# Patient Record
Sex: Female | Born: 1965 | Hispanic: No | Marital: Married | Smoking: Never smoker
Health system: Southern US, Community
[De-identification: ages and names within clinical notes are randomized; demographics above are authoritative.]

## PROBLEM LIST (undated history)

## (undated) DIAGNOSIS — K589 Irritable bowel syndrome without diarrhea: Secondary | ICD-10-CM

## (undated) DIAGNOSIS — T8859XA Other complications of anesthesia, initial encounter: Secondary | ICD-10-CM

## (undated) DIAGNOSIS — L309 Dermatitis, unspecified: Secondary | ICD-10-CM

## (undated) DIAGNOSIS — F32A Depression, unspecified: Secondary | ICD-10-CM

## (undated) DIAGNOSIS — K5790 Diverticulosis of intestine, part unspecified, without perforation or abscess without bleeding: Secondary | ICD-10-CM

## (undated) DIAGNOSIS — I10 Essential (primary) hypertension: Secondary | ICD-10-CM

## (undated) DIAGNOSIS — J45909 Unspecified asthma, uncomplicated: Secondary | ICD-10-CM

## (undated) DIAGNOSIS — J309 Allergic rhinitis, unspecified: Secondary | ICD-10-CM

## (undated) DIAGNOSIS — E039 Hypothyroidism, unspecified: Secondary | ICD-10-CM

## (undated) DIAGNOSIS — E559 Vitamin D deficiency, unspecified: Secondary | ICD-10-CM

## (undated) HISTORY — PX: CARPAL TUNNEL RELEASE: SHX101

## (undated) HISTORY — DX: Diverticulosis of intestine, part unspecified, without perforation or abscess without bleeding: K57.90

## (undated) HISTORY — DX: Hypothyroidism, unspecified: E03.9

## (undated) HISTORY — DX: Allergic rhinitis, unspecified: J30.9

## (undated) HISTORY — PX: OTHER SURGICAL HISTORY: SHX169

## (undated) HISTORY — DX: Irritable bowel syndrome without diarrhea: K58.9

## (undated) HISTORY — PX: FINGER SURGERY: SHX640

## (undated) HISTORY — PX: COLONOSCOPY: SHX174

## (undated) HISTORY — DX: Essential (primary) hypertension: I10

---

## 2001-01-03 ENCOUNTER — Other Ambulatory Visit: Admission: RE | Admit: 2001-01-03 | Discharge: 2001-01-03 | Payer: Self-pay | Admitting: Family Medicine

## 2006-03-09 ENCOUNTER — Ambulatory Visit: Payer: Self-pay

## 2007-08-15 IMAGING — CR CERVICAL SPINE - COMPLETE 4+ VIEW
1 series · 8 of 8 positions shown · non-contrast
Comparison: none

REASON FOR EXAM: xray c-spine pain in neck STAT CALL REPORT [id number]
COMMENTS:

[Series 1: view not recorded · 0.17mm/px · 8 of 8 slices shown]
[im 1/8]
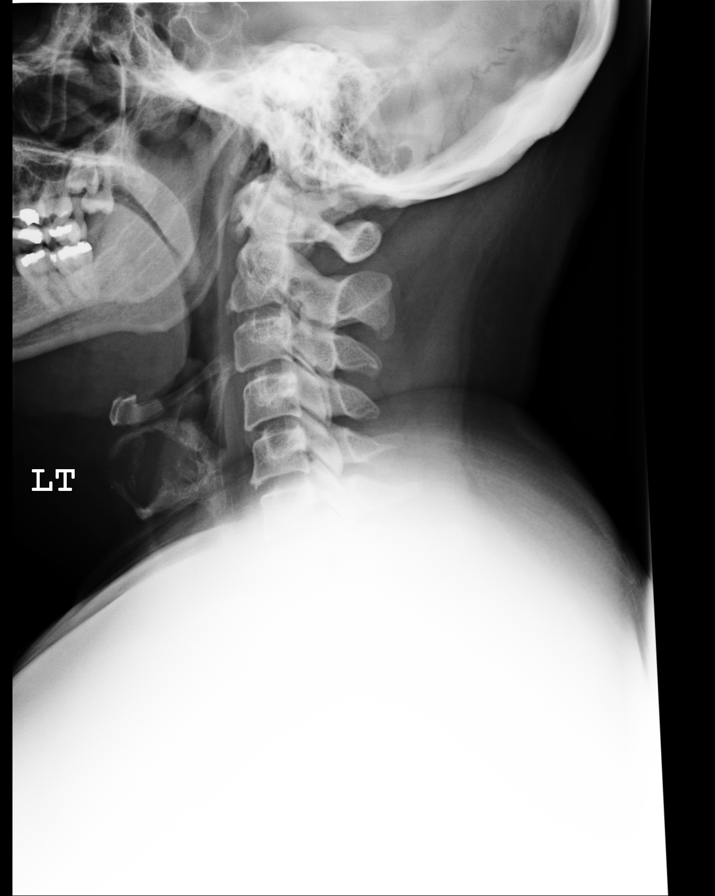
[im 2/8]
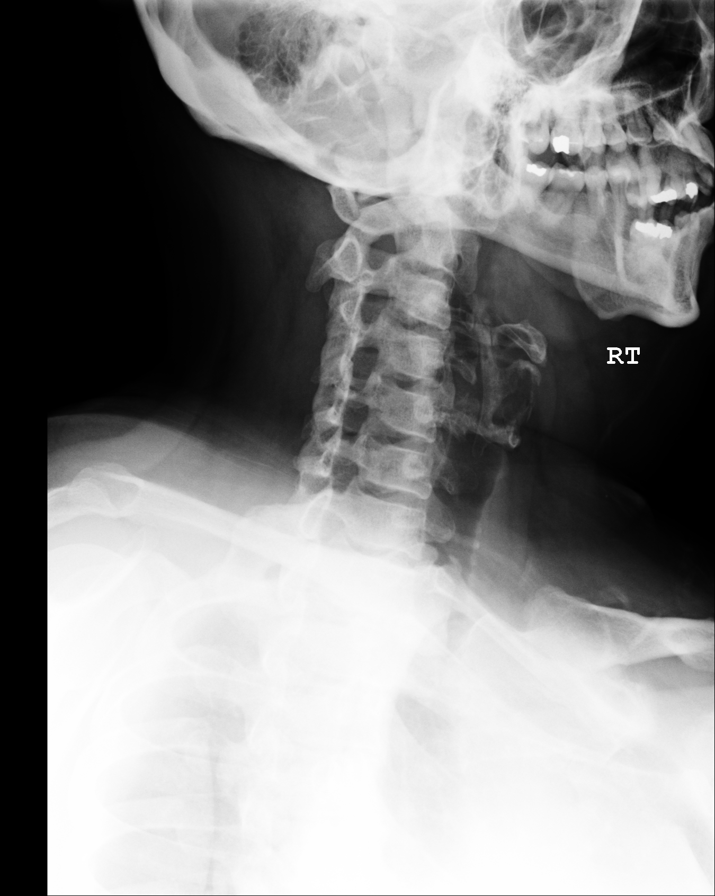
[im 3/8]
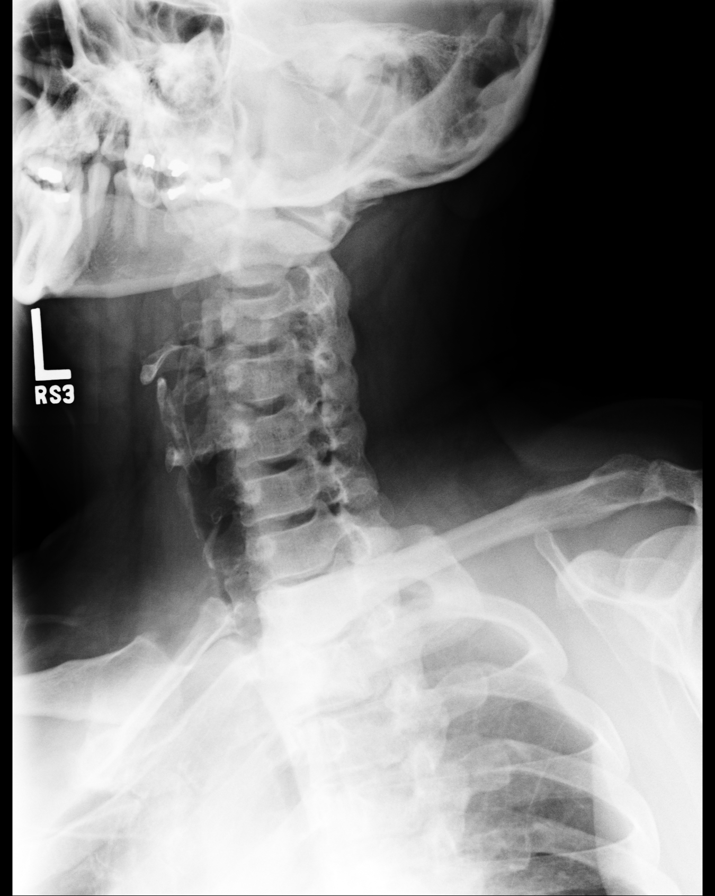
[im 4/8]
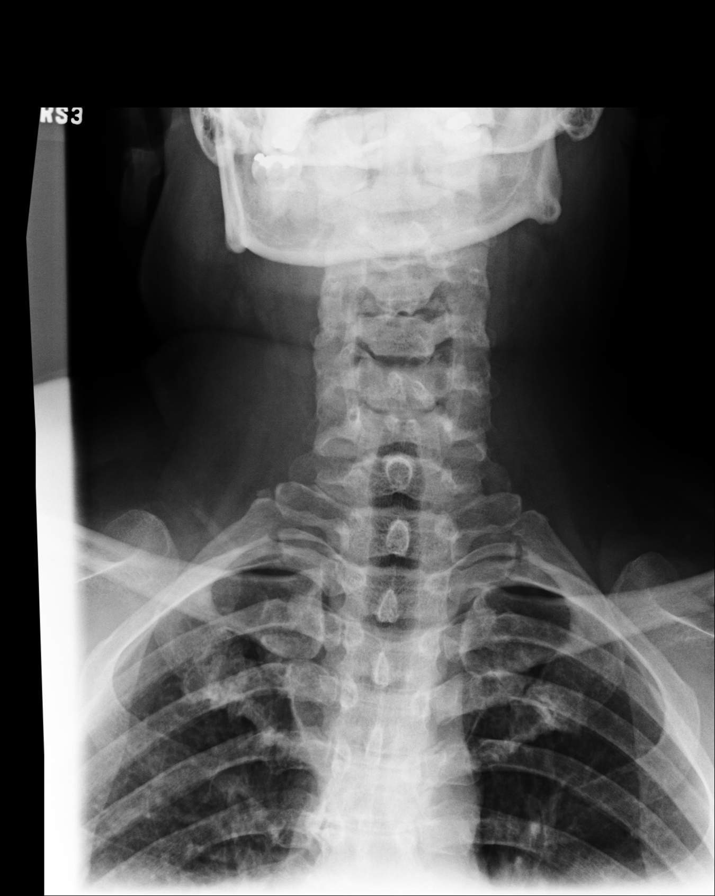
[im 5/8]
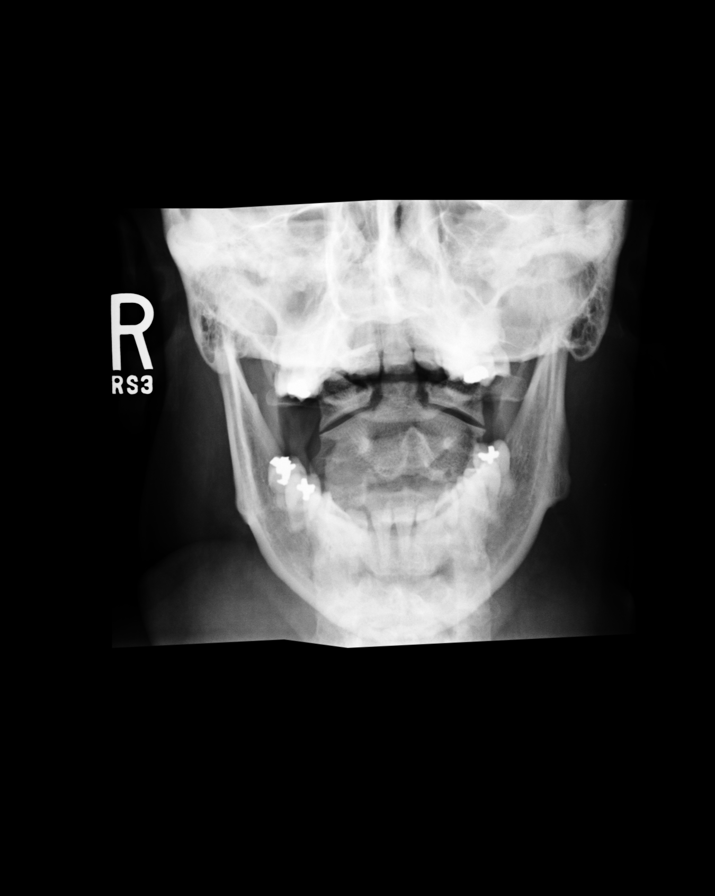
[im 6/8]
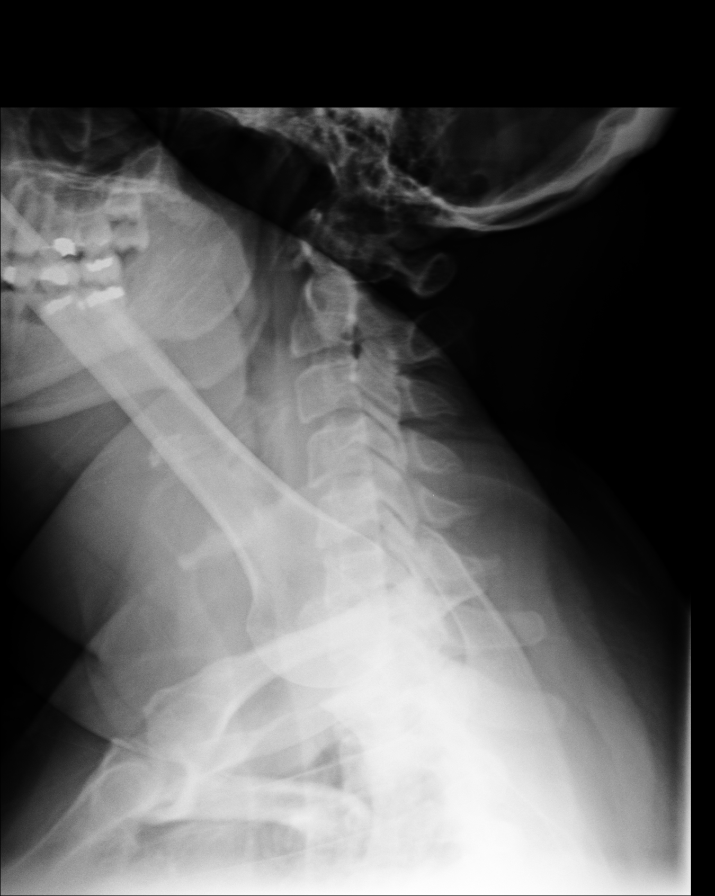
[im 7/8]
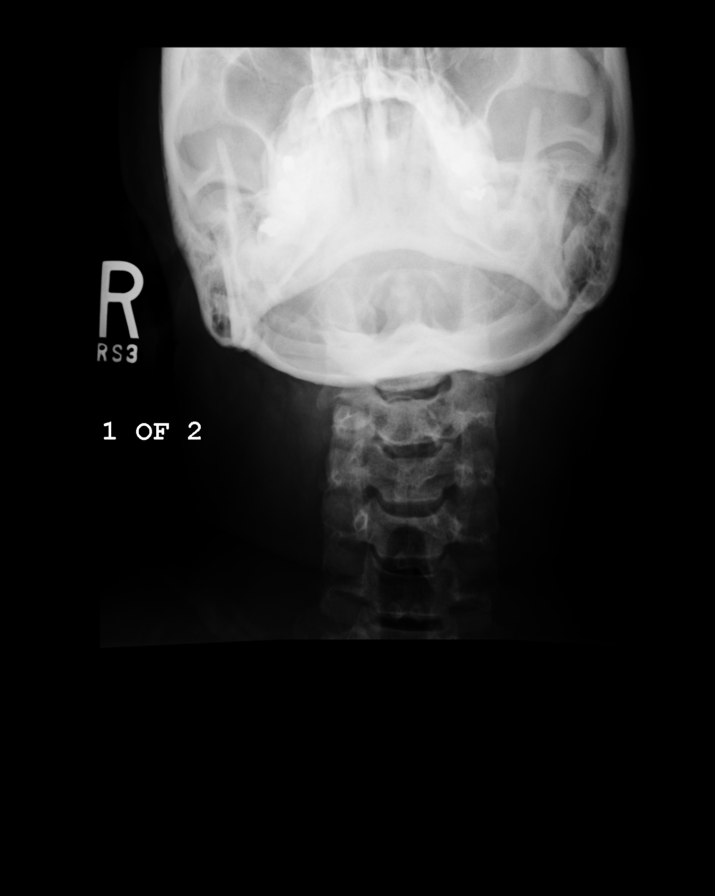
[im 8/8]
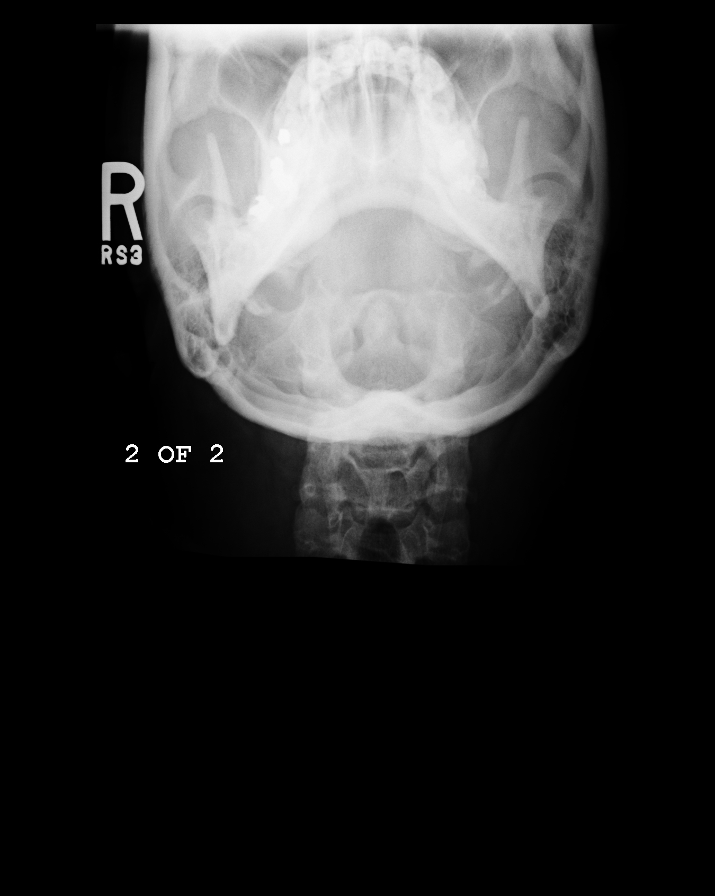

[8 of 8 positions shown; findings below may reference images not displayed]

PROCEDURE:     DXR - DXR CERVICAL SPINE COMPLETE  - March 09, 2006 [DATE]

RESULT:     The vertebral body heights and the intervertebral disc spaces
are well maintained. The vertebral body alignment is normal. Oblique views
show the neural foramina to be patent bilaterally. The odontoid process is
intact. In the AP view there is noted a mild cervicothoracic scoliosis with
the convexity to the LEFT.  The soft tissues of the cervical area show no
significant abnormalities.  No cervical rib formation is seen.
IMPRESSION: 1)No acute changes are identified.

2)There is a mild cervicothoracic scoliosis with the convexity to the LEFT.

3)Although not mentioned above slight degenerative spurring is noted
anteriorly at multiple levels.

## 2010-01-13 ENCOUNTER — Ambulatory Visit: Payer: Self-pay | Admitting: Family Medicine

## 2010-06-26 ENCOUNTER — Ambulatory Visit: Payer: Self-pay | Admitting: Internal Medicine

## 2010-06-30 ENCOUNTER — Ambulatory Visit: Payer: Self-pay | Admitting: Obstetrics and Gynecology

## 2010-06-30 ENCOUNTER — Ambulatory Visit: Payer: Self-pay | Admitting: Internal Medicine

## 2010-07-20 ENCOUNTER — Ambulatory Visit: Payer: Self-pay | Admitting: Internal Medicine

## 2021-03-02 ENCOUNTER — Other Ambulatory Visit: Payer: Self-pay | Admitting: Internal Medicine

## 2021-03-02 DIAGNOSIS — Z1231 Encounter for screening mammogram for malignant neoplasm of breast: Secondary | ICD-10-CM

## 2021-03-09 ENCOUNTER — Other Ambulatory Visit: Payer: Self-pay

## 2021-03-09 ENCOUNTER — Ambulatory Visit
Admission: RE | Admit: 2021-03-09 | Discharge: 2021-03-09 | Disposition: A | Discharge status: Home / Self Care | Payer: Managed Care, Other (non HMO) | Source: Ambulatory Visit | Attending: Internal Medicine | Admitting: Internal Medicine

## 2021-03-09 DIAGNOSIS — Z1231 Encounter for screening mammogram for malignant neoplasm of breast: Secondary | ICD-10-CM | POA: Insufficient documentation

## 2021-03-11 ENCOUNTER — Inpatient Hospital Stay
Admission: RE | Admit: 2021-03-11 | Discharge: 2021-03-11 | Disposition: A | Discharge status: Home / Self Care | Payer: Self-pay | Source: Ambulatory Visit | Attending: *Deleted | Admitting: *Deleted

## 2021-03-11 ENCOUNTER — Other Ambulatory Visit: Payer: Self-pay | Admitting: *Deleted

## 2021-03-11 DIAGNOSIS — Z1231 Encounter for screening mammogram for malignant neoplasm of breast: Secondary | ICD-10-CM

## 2021-03-15 ENCOUNTER — Other Ambulatory Visit: Payer: Self-pay | Admitting: Internal Medicine

## 2021-03-15 DIAGNOSIS — R928 Other abnormal and inconclusive findings on diagnostic imaging of breast: Secondary | ICD-10-CM

## 2021-03-15 DIAGNOSIS — R921 Mammographic calcification found on diagnostic imaging of breast: Secondary | ICD-10-CM

## 2021-03-23 ENCOUNTER — Ambulatory Visit
Admission: RE | Admit: 2021-03-23 | Discharge: 2021-03-23 | Disposition: A | Discharge status: Home / Self Care | Payer: Managed Care, Other (non HMO) | Source: Ambulatory Visit | Attending: Internal Medicine | Admitting: Internal Medicine

## 2021-03-23 ENCOUNTER — Other Ambulatory Visit: Payer: Self-pay

## 2021-03-23 DIAGNOSIS — R928 Other abnormal and inconclusive findings on diagnostic imaging of breast: Secondary | ICD-10-CM

## 2021-03-23 DIAGNOSIS — R921 Mammographic calcification found on diagnostic imaging of breast: Secondary | ICD-10-CM | POA: Diagnosis present

## 2021-05-25 ENCOUNTER — Encounter: Payer: Self-pay | Admitting: Obstetrics and Gynecology

## 2021-05-25 ENCOUNTER — Ambulatory Visit (INDEPENDENT_AMBULATORY_CARE_PROVIDER_SITE_OTHER): Payer: Managed Care, Other (non HMO) | Admitting: Obstetrics and Gynecology

## 2021-05-25 ENCOUNTER — Other Ambulatory Visit: Payer: Self-pay

## 2021-05-25 ENCOUNTER — Other Ambulatory Visit (HOSPITAL_COMMUNITY)
Admission: RE | Admit: 2021-05-25 | Discharge: 2021-05-25 | Disposition: A | Discharge status: Home / Self Care | Payer: Managed Care, Other (non HMO) | Source: Ambulatory Visit | Attending: Obstetrics and Gynecology | Admitting: Obstetrics and Gynecology

## 2021-05-25 VITALS — BP 148/90 | Ht 60.0 in | Wt 235.0 lb

## 2021-05-25 DIAGNOSIS — Z1151 Encounter for screening for human papillomavirus (HPV): Secondary | ICD-10-CM

## 2021-05-25 DIAGNOSIS — Z124 Encounter for screening for malignant neoplasm of cervix: Secondary | ICD-10-CM | POA: Diagnosis not present

## 2021-05-25 DIAGNOSIS — N950 Postmenopausal bleeding: Secondary | ICD-10-CM

## 2021-05-25 DIAGNOSIS — Z01419 Encounter for gynecological examination (general) (routine) without abnormal findings: Secondary | ICD-10-CM

## 2021-05-25 NOTE — Patient Instructions (Signed)
I value your feedback and you entrusting us with your care. If you get a [facility] patient survey, I would appreciate you taking the time to let us know about your experience today. Thank you!  [facility]: [phone]  [facility]: [phone]    

## 2021-05-25 NOTE — Progress Notes (Signed)
PCP: Rusty Aus, MD   Chief Complaint  Patient presents with   Gynecologic Exam    HPI:      [name] is a 55 y.o. No obstetric history on file. whose LMP was No LMP recorded. Patient is postmenopausal., presents today for her annual examination.  Her menses are had been absent for many yrs due to menopause. She has brown dc/ for a few days about every 3 wks now; has been going on for a few yrs but increased frequency now. Has a vaginal burning sensation like a "rip" and then notes brown d/c in her underwear. Pt thinks brown d/c is related to vaginal dryness and tears since that is what if feels like, but pt denies any red, fresh blood with sx. Had neg EMB 2013 and 2014 for menometrorrhagia with Dr. Glennon Mac and Dr. Georgianne Fick.   Sex activity: not sexually active. She does have vaginal dryness. Uses sensitive skin soap, no dryer sheets or wipes, but wears pads daily for urin incont. Sometimes has vaginal itching.  Last Pap: 07/11/17 Results were: no abnormalities /neg HPV DNA. Hx of abn paps in past with bx, no tx.  Last mammogram: 03/23/21 Results were: normal--routine follow-up in 12 months  There is no FH of breast cancer. There is no FH of ovarian cancer. The patient does do self-breast exams.  Colonoscopy: scheduled for 11/22; Has had 2 prior ones with polyps   Tobacco use: The patient denies current or previous tobacco use. Alcohol use: none Exercise: not active  She does get adequate calcium and Vitamin D in her diet. Hx of Vit D deficiency  Labs with PCP.   Past Medical History:  Diagnosis Date   Allergic rhinitis    Diverticulosis    Hypertension    Hypothyroidism (acquired)    IBS (irritable bowel syndrome)     Past Surgical History:  Procedure Laterality Date   CARPAL TUNNEL RELEASE     CESAREAN SECTION     COLONOSCOPY     FINGER SURGERY     OTHER SURGICAL HISTORY     Rectum Surgery    Family History  Problem Relation Age of Onset    Esophageal cancer Brother    Breast cancer Neg Hx     Social History   Socioeconomic History   Marital status: Married    Spouse name: Not on file   Number of children: Not on file   Years of education: Not on file   Highest education level: Not on file  Occupational History   Not on file  Tobacco Use   Smoking status: Never   Smokeless tobacco: Never  Vaping Use   Vaping Use: Never used  Substance and Sexual Activity   Alcohol use: Never   Drug use: Never   Sexual activity: Not Currently  Other Topics Concern   Not on file  Social History Narrative   Not on file   Social Determinants of Health   Financial Resource Strain: Not on file  Food Insecurity: Not on file  Transportation Needs: Not on file  Physical Activity: Not on file  Stress: Not on file  Social Connections: Not on file  Intimate Partner Violence: Not on file     Current Outpatient Medications:    cyanocobalamin 1000 MCG tablet, Take by mouth., Disp: , Rfl:    gabapentin (NEURONTIN) 100 MG capsule, Take 100 mg by mouth at bedtime as needed., Disp: , Rfl:    levothyroxine (SYNTHROID) 150  MCG tablet, Take 150 mcg by mouth every morning., Disp: , Rfl:    losartan (COZAAR) 100 MG tablet, Take 100 mg by mouth daily., Disp: , Rfl:    montelukast (SINGULAIR) 10 MG tablet, Take 10 mg by mouth daily., Disp: , Rfl:    polyethylene glycol-electrolytes (NULYTELY) 420 g solution, Take 4,000 mLs by mouth as directed., Disp: , Rfl:    TURMERIC PO, Take by mouth., Disp: , Rfl:    XIIDRA 5 % SOLN, Apply 1 drop to eye 2 (two) times daily., Disp: , Rfl:      ROS:  Review of Systems  Constitutional:  Negative for fatigue, fever and unexpected weight change.  Respiratory:  Negative for cough, shortness of breath and wheezing.   Cardiovascular:  Negative for chest pain, palpitations and leg swelling.  Gastrointestinal:  Negative for blood in stool, constipation, diarrhea, nausea and vomiting.  Endocrine: Negative  for cold intolerance, heat intolerance and polyuria.  Genitourinary:  Positive for vaginal bleeding. Negative for dyspareunia, dysuria, flank pain, frequency, genital sores, hematuria, menstrual problem, pelvic pain, urgency, vaginal discharge and vaginal pain.  Musculoskeletal:  Positive for arthralgias. Negative for back pain, joint swelling and myalgias.  Skin:  Negative for rash.  Neurological:  Negative for dizziness, syncope, light-headedness, numbness and headaches.  Hematological:  Negative for adenopathy.  Psychiatric/Behavioral:  Negative for agitation, confusion, sleep disturbance and suicidal ideas. The patient is not nervous/anxious.   BREAST: No symptoms    Objective: BP (!) 148/90   Ht 5' (1.524 m)   Wt 235 lb (106.6 kg)   BMI 45.90 kg/m    Physical Exam Constitutional:      Appearance: She is well-developed.  Genitourinary:     Vulva normal.     Right Labia: No rash, tenderness or lesions.    Left Labia: No tenderness, lesions or rash.    No vaginal discharge, erythema or tenderness.      Right Adnexa: not tender and no mass present.    Left Adnexa: not tender and no mass present.    No cervical friability or polyp.     Uterus is not enlarged or tender.  Breasts:    Right: No mass, nipple discharge, skin change or tenderness.     Left: No mass, nipple discharge, skin change or tenderness.  Neck:     Thyroid: No thyromegaly.  Cardiovascular:     Rate and Rhythm: Normal rate and regular rhythm.     Heart sounds: Normal heart sounds. No murmur heard. Pulmonary:     Effort: Pulmonary effort is normal.     Breath sounds: Normal breath sounds.  Abdominal:     Palpations: Abdomen is soft.     Tenderness: There is no abdominal tenderness. There is no guarding or rebound.  Musculoskeletal:        General: Normal range of motion.     Cervical back: Normal range of motion.  Lymphadenopathy:     Cervical: No cervical adenopathy.  Neurological:     General: No  focal deficit present.     Mental Status: She is alert and oriented to person, place, and time.     Cranial Nerves: No cranial nerve deficit.  Skin:    General: Skin is warm and dry.  Psychiatric:        Mood and Affect: Mood normal.        Behavior: Behavior normal.        Thought Content: Thought content normal.  Judgment: Judgment normal.  Vitals reviewed.    Assessment/Plan:  Encounter for annual routine gynecological examination  Cervical cancer screening - Plan: Cytology - PAP  Screening for HPV (human papillomavirus) - Plan: Cytology - PAP  PMB (postmenopausal bleeding) - Plan: US PELVIS TRANSVAGINAL NON-OB (TV ONLY); for sev yrs, increasing in frequency. Neg exam. Check pap/GYN u/s. Will f/u with results. Discussed EMB if EM thickened.           GYN counsel breast self exam, mammography screening, menopause, adequate intake of calcium and vitamin D, diet and exercise    F/U  Return in about 1 week (around 06/01/2021) for GYN u/s for PMB at [facility]; ABC to call pt.  Gratia Disla B. Bobbyjo Marulanda, PA-C 05/25/2021 2:08 PM

## 2021-05-27 LAB — CYTOLOGY - PAP
Comment: NEGATIVE
Diagnosis: NEGATIVE
High risk HPV: NEGATIVE

## 2021-06-22 ENCOUNTER — Ambulatory Visit (INDEPENDENT_AMBULATORY_CARE_PROVIDER_SITE_OTHER): Payer: Managed Care, Other (non HMO)

## 2021-06-22 ENCOUNTER — Other Ambulatory Visit: Payer: Self-pay

## 2021-06-22 DIAGNOSIS — N950 Postmenopausal bleeding: Secondary | ICD-10-CM

## 2021-06-27 ENCOUNTER — Telehealth: Payer: Self-pay | Admitting: Obstetrics and Gynecology

## 2021-06-27 NOTE — Telephone Encounter (Signed)
Pt aware of GYN u/s results and appt sched with MD for hysteroscopy for further eval.

## 2021-07-07 ENCOUNTER — Encounter: Payer: Self-pay | Admitting: Obstetrics and Gynecology

## 2021-07-07 ENCOUNTER — Ambulatory Visit: Payer: Managed Care, Other (non HMO) | Admitting: Obstetrics and Gynecology

## 2021-07-07 ENCOUNTER — Other Ambulatory Visit: Payer: Self-pay

## 2021-07-07 VITALS — BP 163/87 | Ht 59.0 in | Wt 236.0 lb

## 2021-07-07 DIAGNOSIS — N950 Postmenopausal bleeding: Secondary | ICD-10-CM | POA: Diagnosis not present

## 2021-07-07 DIAGNOSIS — N9489 Other specified conditions associated with female genital organs and menstrual cycle: Secondary | ICD-10-CM | POA: Diagnosis not present

## 2021-07-07 NOTE — Progress Notes (Signed)
Obstetrics & Gynecology Surgery H&P    Chief Complaint: Scheduled Surgery   History of Present Illness: Patient is a 55 y.o. G1P1001 presenting for scheduled hysteroscopy, D&C  for the treatment or further evaluation of PMB, endometrial lesion.   Prior Treatments prior to proceeding with surgery include: imaging  Preoperative Pap: 05/25/2021 Results: no abnormalities  Preoperative Endometrial biopsy: N/A focal lesion Preoperative Ultrasound: 06/22/2021 endometrium obscured by 2.2 x 1.9 x 2.0cm hyperechoic mass in the mid fundus with a feeder vessel on doppler either representing a submucosal fibroid or endometrial polyp   Review of Systems:10 point review of systems  Past Medical History:  Patient Active Problem List   Diagnosis Date Noted   PMB (postmenopausal bleeding) 05/25/2021    Past Surgical History:  Past Surgical History:  Procedure Laterality Date   CARPAL TUNNEL RELEASE     CESAREAN SECTION     COLONOSCOPY     FINGER SURGERY     OTHER SURGICAL HISTORY     Rectum Surgery    Family History:  Family History  Problem Relation Age of Onset   Esophageal cancer Brother    Breast cancer Neg Hx     Social History:  Social History   Socioeconomic History   Marital status: Married    Spouse name: Not on file   Number of children: Not on file   Years of education: Not on file   Highest education level: Not on file  Occupational History   Not on file  Tobacco Use   Smoking status: Never   Smokeless tobacco: Never  Vaping Use   Vaping Use: Never used  Substance and Sexual Activity   Alcohol use: Never   Drug use: Never   Sexual activity: Not Currently  Other Topics Concern   Not on file  Social History Narrative   Not on file   Social Determinants of Health   Financial Resource Strain: Not on file  Food Insecurity: Not on file  Transportation Needs: Not on file  Physical Activity: Not on file  Stress: Not on file  Social Connections: Not on  file  Intimate Partner Violence: Not on file    Allergies:  Allergies  Allergen Reactions   Codeine Hives and Itching   Venlafaxine Hives   Lisinopril Other (See Comments)    Medications: Prior to Admission medications   Medication Sig Start Date End Date Taking? Authorizing Provider  cyanocobalamin 1000 MCG tablet Take by mouth.    [provider]  gabapentin (NEURONTIN) 100 MG capsule Take 100 mg by mouth at bedtime as needed. 01/06/21   [provider]  levothyroxine (SYNTHROID) 150 MCG tablet Take 150 mcg by mouth every morning. 01/06/21   [provider]  losartan (COZAAR) 100 MG tablet Take 100 mg by mouth daily. 03/07/21   [provider]  montelukast (SINGULAIR) 10 MG tablet Take 10 mg by mouth daily. 04/01/21   [provider]  polyethylene glycol-electrolytes (NULYTELY) 420 g solution Take 4,000 mLs by mouth as directed. 04/01/21   [provider]  TURMERIC PO Take by mouth.    [provider]  XIIDRA 5 % SOLN Apply 1 drop to eye 2 (two) times daily. 12/15/20   [provider]    Physical Exam Vitals: Blood pressure (!) 163/87, height 4\' 11"  (1.499 m), weight 236 lb (107 kg). General: NAD HEENT: normocephalic, anicteric Pulmonary: No increased work of breathing Extremities: no edema, erythema, or tenderness Neurologic: Grossly intact Psychiatric: mood appropriate,  affect full  Imaging US PELVIS TRANSVAGINAL NON-OB (TV ONLY)  Result Date: 06/27/2021 Patient Name: Kellie Obrien DOB: 02-04-1966 MRN: [id number] ULTRASOUND REPORT Location: [facility] Date of Service: 06/22/2021 Study limited by body habitus and anatomy. Indications:    Postmenopausal bleeding, pain Findings: The uterus is anteverted and measures 9.7 x 6.2 x 4.8 cm. Echo texture is heterogenous with evidence of focal masses. Within the uterus is suspected fibroid measuring: Fibroid 1:   2.2 x 1.9 x 2.0  ...this is hypoechoic and is  favored to be a submucosal fibroid, however, there is what could be a feeder vessel to an endometrial polyp seen on color doppler, with minimal internal flow seen.  The Endometrium is obscured and can not be visualized. Ovaries are not visualized. Survey of the adnexa demonstrates no adnexal masses. There is no free fluid in the cul de sac. Impression: 1. 2.2 cm hypoechoic mass mid fundus.   Recommendations: 1.Clinical correlation with the patient's History and Physical Exam. Vivien Rota  [place] Review of ULTRASOUND.    I have personally reviewed images and report of recent ultrasound done at [facility].    Plan of management to be discussed with pa Copland Barnett Applebaum, MD, [facility], [facility] 06/27/2021  7:53 AM   Assessment: 55 y.o. G1P1001 presenting for scheduled hysteroscopy D&C  Plan: 1) I have discussed with the patient the indications for the procedure. Included in the discussion were the options of therapy, as wall as their individual risks, benefits, and complications. Ample time was given to answer all questions.   In office pipelle biopsy generally provides comparable results to [facility], however this sampling modality may miss focal lesions if these were previously documented on ultrasound.  It is because of the potential to miss focal lesions that hysteroscopy D&C is also warranted in patient with continued postmenopausal bleeding that is not self limited regardless of prior in office biopsy results or ultrasound findings.  She understands that the risk of continued observation include worsening bleeding or worsening of any underlying pathology.  The choices include: 1. Doing nothing but following her symptoms 2. Attempts at hormonal manipulation with either BCP or Depo-Provera for premenopausal patients with no concern for focal lesion or endometrial pathology 3. D&C/hysteroscopy. 4. Endometrial ablation via Novasure or other techniques for premenopausal patients with no  concern for focal lesion or endometrial pathology  5. As final resort, hysterectomy. After consideration of her history and findings, mutual decision has been made to proceed with D+C/hysteroscopy. While the incidence is low, the risks from this surgery include, but are not limited to, the risks of anesthesia, hemorrhage, infection, perforation, and injury to adjacent structures including bowel, bladder and blood vessels.  - post hysteroscopy D&C  2) Routine postoperative instructions were reviewed with the patient and her family in detail today including the expected length of recovery and likely postoperative course.  The patient concurred with the proposed plan, giving informed written consent for the surgery today.  Patient instructed on the importance of being NPO after midnight prior to her procedure.  If warranted preoperative prophylactic antibiotics and SCDs ordered on call to the OR to meet SCIP guidelines and adhere to recommendation laid forth in [place] Number 104 May 2009  "Antibiotic Prophylaxis for Gynecologic Procedures".     Malachy Mood, MD, [facility], [facility] 07/07/2021, 12:03 PM

## 2021-07-11 NOTE — Telephone Encounter (Signed)
LVM on home phone - cell # not working correctly  Sent my chart msg

## 2021-07-14 ENCOUNTER — Encounter: Payer: Self-pay | Admitting: *Deleted

## 2021-07-15 ENCOUNTER — Encounter: Payer: Self-pay | Admitting: *Deleted

## 2021-07-15 ENCOUNTER — Ambulatory Visit: Payer: Managed Care, Other (non HMO) | Admitting: Certified Registered Nurse Anesthetist

## 2021-07-15 ENCOUNTER — Ambulatory Visit
Admission: RE | Admit: 2021-07-15 | Discharge: 2021-07-15 | Disposition: A | Discharge status: Home / Self Care | Payer: Managed Care, Other (non HMO) | Attending: Gastroenterology | Admitting: Gastroenterology

## 2021-07-15 ENCOUNTER — Encounter
Admission: RE | Disposition: A | Discharge status: Home / Self Care | Payer: Self-pay | Source: Home / Self Care | Attending: Gastroenterology

## 2021-07-15 DIAGNOSIS — K5730 Diverticulosis of large intestine without perforation or abscess without bleeding: Secondary | ICD-10-CM | POA: Diagnosis not present

## 2021-07-15 DIAGNOSIS — K589 Irritable bowel syndrome without diarrhea: Secondary | ICD-10-CM | POA: Insufficient documentation

## 2021-07-15 DIAGNOSIS — Z86010 Personal history of colonic polyps: Secondary | ICD-10-CM | POA: Diagnosis not present

## 2021-07-15 DIAGNOSIS — Z1211 Encounter for screening for malignant neoplasm of colon: Secondary | ICD-10-CM | POA: Insufficient documentation

## 2021-07-15 DIAGNOSIS — I10 Essential (primary) hypertension: Secondary | ICD-10-CM | POA: Diagnosis not present

## 2021-07-15 DIAGNOSIS — D120 Benign neoplasm of cecum: Secondary | ICD-10-CM | POA: Diagnosis not present

## 2021-07-15 DIAGNOSIS — K621 Rectal polyp: Secondary | ICD-10-CM | POA: Insufficient documentation

## 2021-07-15 DIAGNOSIS — J45909 Unspecified asthma, uncomplicated: Secondary | ICD-10-CM | POA: Insufficient documentation

## 2021-07-15 DIAGNOSIS — E039 Hypothyroidism, unspecified: Secondary | ICD-10-CM | POA: Diagnosis not present

## 2021-07-15 DIAGNOSIS — K640 First degree hemorrhoids: Secondary | ICD-10-CM | POA: Insufficient documentation

## 2021-07-15 HISTORY — DX: Depression, unspecified: F32.A

## 2021-07-15 HISTORY — DX: Dermatitis, unspecified: L30.9

## 2021-07-15 HISTORY — DX: Unspecified asthma, uncomplicated: J45.909

## 2021-07-15 HISTORY — DX: Vitamin D deficiency, unspecified: E55.9

## 2021-07-15 HISTORY — PX: COLONOSCOPY WITH PROPOFOL: SHX5780

## 2021-07-15 SURGERY — COLONOSCOPY WITH PROPOFOL
Anesthesia: General

## 2021-07-15 MED ORDER — DEXMEDETOMIDINE (PRECEDEX) IN NS 20 MCG/5ML (4 MCG/ML) IV SYRINGE
PREFILLED_SYRINGE | INTRAVENOUS | Status: DC | PRN
  Administered 2021-07-15: 8 ug via INTRAVENOUS

## 2021-07-15 MED ORDER — PROPOFOL 500 MG/50ML IV EMUL
INTRAVENOUS | Status: DC | PRN
  Administered 2021-07-15: 150 ug/kg/min via INTRAVENOUS

## 2021-07-15 MED ORDER — SODIUM CHLORIDE 0.9 % IV SOLN: INTRAVENOUS | Status: DC

## 2021-07-15 MED ORDER — LIDOCAINE HCL (CARDIAC) PF 100 MG/5ML IV SOSY
PREFILLED_SYRINGE | INTRAVENOUS | Status: DC | PRN
  Administered 2021-07-15: 50 mg via INTRAVENOUS

## 2021-07-15 MED ORDER — PROPOFOL 10 MG/ML IV BOLUS
INTRAVENOUS | Status: DC | PRN
  Administered 2021-07-15: 60 mg via INTRAVENOUS
  Administered 2021-07-15 (×2): 20 mg via INTRAVENOUS

## 2021-07-15 MED ORDER — OXYMETAZOLINE HCL 0.05 % NA SOLN
NASAL | Status: DC | PRN
  Administered 2021-07-15: 2 via NASAL

## 2021-07-15 NOTE — Op Note (Signed)
[facility] Gastroenterology Patient Name: Kellie Obrien Procedure Date: 07/15/2021 9:39 AM MRN: [id number] Account #: [id number] Date of Birth: 03/25/1966 Admit Type: Outpatient Age: 55 Room: [facility] ENDO ROOM 3 Gender: Female Note Status: Finalized Instrument Name: Kellie Obrien [id number] Procedure:             Colonoscopy Indications:           Surveillance: Personal history of adenomatous polyps                         on last colonoscopy > 5 years ago Providers:             Andrey Farmer MD, MD Referring MD:          No Local Md, MD (Referring MD) Medicines:             Monitored Anesthesia Care Complications:         No immediate complications. Estimated blood loss:                         Minimal. Procedure:             Pre-Anesthesia Assessment:                        - Prior to the procedure, a History and Physical was                         performed, and patient medications and allergies were                         reviewed. The patient is competent. The risks and                         benefits of the procedure and the sedation options and                         risks were discussed with the patient. All questions                         were answered and informed consent was obtained.                         Patient identification and proposed procedure were                         verified by the physician, the nurse, the anesthetist                         and the technician in the endoscopy suite. Mental                         Status Examination: alert and oriented. Airway                         Examination: normal oropharyngeal airway and neck                         mobility. Respiratory Examination: clear to  auscultation. CV Examination: normal. Prophylactic                         Antibiotics: The patient does not require prophylactic                         antibiotics. Prior Anticoagulants: The patient has                          taken no previous anticoagulant or antiplatelet                         agents. ASA Grade Assessment: III - A patient with                         severe systemic disease. After reviewing the risks and                         benefits, the patient was deemed in satisfactory                         condition to undergo the procedure. The anesthesia                         plan was to use monitored anesthesia care (MAC).                         Immediately prior to administration of medications,                         the patient was re-assessed for adequacy to receive                         sedatives. The heart rate, respiratory rate, oxygen                         saturations, blood pressure, adequacy of pulmonary                         ventilation, and response to care were monitored                         throughout the procedure. The physical status of the                         patient was re-assessed after the procedure.                        After obtaining informed consent, the colonoscope was                         passed under direct vision. Throughout the procedure,                         the patient's blood pressure, pulse, and oxygen                         saturations were monitored continuously. The  Colonoscope was introduced through the anus and                         advanced to the the cecum, identified by appendiceal                         orifice and ileocecal valve. The colonoscopy was                         somewhat difficult due to significant looping.                         Successful completion of the procedure was aided by                         applying abdominal pressure. The patient tolerated the                         procedure well. The quality of the bowel preparation                         was good. Findings:      The perianal and digital rectal examinations were normal.      A 2 mm polyp was found in  the cecum. The polyp was sessile. The polyp       was removed with a jumbo cold forceps. Resection and retrieval were       complete. Estimated blood loss was minimal.      A 3 mm polyp was found in the ileocecal valve. The polyp was sessile.       The polyp was removed with a cold snare. Resection and retrieval were       complete. Estimated blood loss was minimal.      A 1 mm polyp was found in the rectum. The polyp was sessile. The polyp       was removed with a jumbo cold forceps. Resection and retrieval were       complete. Estimated blood loss was minimal.      Scattered small-mouthed diverticula were found in the sigmoid colon and       descending colon.      Internal hemorrhoids were found during retroflexion. The hemorrhoids       were Grade I (internal hemorrhoids that do not prolapse).      The exam was otherwise without abnormality on direct and retroflexion       views. Impression:            - One 2 mm polyp in the cecum, removed with a jumbo                         cold forceps. Resected and retrieved.                        - One 3 mm polyp at the ileocecal valve, removed with                         a cold snare. Resected and retrieved.                        - One 1 mm  polyp in the rectum, removed with a jumbo                         cold forceps. Resected and retrieved.                        - Diverticulosis in the sigmoid colon and in the                         descending colon.                        - Internal hemorrhoids.                        - The examination was otherwise normal on direct and                         retroflexion views. Recommendation:        - Discharge patient to home.                        - Resume previous diet.                        - Continue present medications.                        - Await pathology results.                        - Repeat colonoscopy for surveillance based on                         pathology results.                         - Return to referring physician as previously                         scheduled. Procedure Code(s):     --- Professional ---                        [phone], Colonoscopy, flexible; with removal of                         tumor(s), polyp(s), or other lesion(s) by snare                         technique                        45380, 61, Colonoscopy, flexible; with biopsy, single                         or multiple Diagnosis Code(s):     --- Professional ---                        Z86.010, Personal history of colonic polyps                        K63.5, Polyp of colon  K62.1, Rectal polyp                        K64.0, First degree hemorrhoids                        K57.30, Diverticulosis of large intestine without                         perforation or abscess without bleeding CPT copyright 2019 American Medical Association. All rights reserved. The codes documented in this report are preliminary and upon coder review may  be revised to meet current compliance requirements. Andrey Farmer MD, MD 07/15/2021 10:05:29 AM Number of Addenda: 0 Note Initiated On: 07/15/2021 9:39 AM Scope Withdrawal Time: 0 hours 12 minutes 58 seconds  Total Procedure Duration: 0 hours 21 minutes 3 seconds  Estimated Blood Loss:  Estimated blood loss was minimal.      [facility]

## 2021-07-15 NOTE — Interval H&P Note (Signed)
History and Physical Interval Note:  07/15/2021 9:27 AM  Kellie Obrien  has presented today for surgery, with the diagnosis of Z86.010 Personal History of Colon Polyps.  The various methods of treatment have been discussed with the patient and family. After consideration of risks, benefits and other options for treatment, the patient has consented to  Procedure(s): COLONOSCOPY WITH PROPOFOL (N/A) as a surgical intervention.  The patient's history has been reviewed, patient examined, no change in status, stable for surgery.  I have reviewed the patient's chart and labs.  Questions were answered to the patient's satisfaction.     Lesly Rubenstein  Ok to proceed with colonoscopy

## 2021-07-15 NOTE — H&P (Signed)
Outpatient short stay form Pre-procedure 07/15/2021  Kellie Rubenstein, MD  Primary Physician: Rusty Aus, MD  Reason for visit:  Surveillance colonoscopy  History of present illness:   55 y/o lady with history of hypothyroidism, hypertension, and obesity. No blood thinners. History of c section. No other significant abdominal surgeries. No family history of GI malignancies.   No current facility-administered medications for this encounter.  Medications Prior to Admission  Medication Sig Dispense Refill Last Dose   Cholecalciferol (VITAMIN D3) 50 MCG (2000 UT) capsule Take 2,000 Units by mouth daily.   07/14/2021   cyanocobalamin 1000 MCG tablet Take by mouth.   07/14/2021   hydrochlorothiazide (HYDRODIURIL) 25 MG tablet Take 25 mg by mouth daily.    at prn   levocetirizine (XYZAL) 5 MG tablet Take 5 mg by mouth every evening.   Past Week   levothyroxine (SYNTHROID) 150 MCG tablet Take 150 mcg by mouth every morning.   07/15/2021 at 0300   montelukast (SINGULAIR) 10 MG tablet Take 10 mg by mouth daily.   07/14/2021   polyethylene glycol-electrolytes (NULYTELY) 420 g solution Take 4,000 mLs by mouth as directed.   07/14/2021   TURMERIC PO Take by mouth.   07/14/2021   XIIDRA 5 % SOLN Apply 1 drop to eye 2 (two) times daily.   07/14/2021   gabapentin (NEURONTIN) 100 MG capsule Take 100 mg by mouth at bedtime as needed.    at prn   losartan (COZAAR) 100 MG tablet Take 100 mg by mouth daily. (Patient not taking: Reported on 07/15/2021)   Not Taking     Allergies  Allergen Reactions   Codeine Hives and Itching   Venlafaxine Hives   Lisinopril Other (See Comments)    cough     Past Medical History:  Diagnosis Date   Allergic rhinitis    Asthma    Depression    Dermatitis    Diverticulosis    Hypertension    Hypothyroidism (acquired)    IBS (irritable bowel syndrome)    Vitamin D deficiency     Review of systems:  Otherwise negative.    Physical Exam  Gen:  Alert, oriented. Appears stated age.  HEENT: PERRLA. Lungs: No respiratory distress CV: RRR Abd: soft, benign, no masses Ext: No edema    Planned procedures: Proceed with colonoscopy. The patient understands the nature of the planned procedure, indications, risks, alternatives and potential complications including but not limited to bleeding, infection, perforation, damage to internal organs and possible oversedation/side effects from anesthesia. The patient agrees and gives consent to proceed.  Please refer to procedure notes for findings, recommendations and patient disposition/instructions.     Kellie Rubenstein, MD [facility]

## 2021-07-15 NOTE — Anesthesia Preprocedure Evaluation (Signed)
Anesthesia Evaluation  Patient identified by MRN, date of birth, ID band Patient awake    Reviewed: Allergy & Precautions, NPO status , Patient's Chart, lab work & pertinent test results  Airway Mallampati: II  TM Distance: >3 FB Neck ROM: full    Dental  (+) Teeth Intact   Pulmonary neg pulmonary ROS, asthma ,    Pulmonary exam normal  + decreased breath sounds      Cardiovascular Exercise Tolerance: Good hypertension, Pt. on medications negative cardio ROS Normal cardiovascular exam Rhythm:Regular Rate:Normal     Neuro/Psych Depression negative neurological ROS  negative psych ROS   GI/Hepatic negative GI ROS, Neg liver ROS,   Endo/Other  negative endocrine ROSHypothyroidism   Renal/GU negative Renal ROS  negative genitourinary   Musculoskeletal negative musculoskeletal ROS (+)   Abdominal (+) + obese,   Peds negative pediatric ROS (+)  Hematology negative hematology ROS (+)   Anesthesia Other Findings Past Medical History: No date: Allergic rhinitis No date: Asthma No date: Depression No date: Dermatitis No date: Diverticulosis No date: Hypertension No date: Hypothyroidism (acquired) No date: IBS (irritable bowel syndrome) No date: Vitamin D deficiency  Past Surgical History: No date: CARPAL TUNNEL RELEASE No date: CESAREAN SECTION No date: COLONOSCOPY No date: FINGER SURGERY No date: OTHER SURGICAL HISTORY     Comment:  Rectum Surgery  BMI    Body Mass Index: 47.69 kg/m      Reproductive/Obstetrics negative OB ROS                             Anesthesia Physical Anesthesia Plan  ASA: 3  Anesthesia Plan: General   Post-op Pain Management:    Induction: Intravenous  PONV Risk Score and Plan: Propofol infusion and TIVA  Airway Management Planned: Nasal Cannula  Additional Equipment:   Intra-op Plan:   Post-operative Plan:   Informed Consent: I have  reviewed the patients History and Physical, chart, labs and discussed the procedure including the risks, benefits and alternatives for the proposed anesthesia with the patient or authorized representative who has indicated his/her understanding and acceptance.     Dental Advisory Given  Plan Discussed with: CRNA and Surgeon  Anesthesia Plan Comments:         Anesthesia Quick Evaluation

## 2021-07-15 NOTE — Anesthesia Postprocedure Evaluation (Signed)
Anesthesia Post Note  Patient: Kellie Obrien  Procedure(s) Performed: COLONOSCOPY WITH PROPOFOL  Patient location during evaluation: PACU Anesthesia Type: General Level of consciousness: awake Pain management: pain level controlled Vital Signs Assessment: post-procedure vital signs reviewed and stable Respiratory status: spontaneous breathing and respiratory function stable Cardiovascular status: blood pressure returned to baseline Anesthetic complications: no   No notable events documented.   Last Vitals:  Vitals:   07/15/21 1020 07/15/21 1031  BP: (!) 146/70 (!) 148/76  Pulse: 77 82  Resp: 16 19  Temp:    SpO2: 99% 99%    Last Pain:  Vitals:   07/15/21 1031  TempSrc:   PainSc: 0-No pain                 VAN STAVEREN,Iya Hamed

## 2021-07-15 NOTE — Transfer of Care (Signed)
Immediate Anesthesia Transfer of Care Note  Patient: Kellie Obrien  Procedure(s) Performed: COLONOSCOPY WITH PROPOFOL  Patient Location: Endoscopy Unit  Anesthesia Type:General  Level of Consciousness: drowsy  Airway & Oxygen Therapy: Patient Spontanous Breathing and Patient connected to face mask oxygen  Post-op Assessment: Report given to RN and Post -op Vital signs reviewed and stable  Post vital signs: Reviewed and stable  Last Vitals:  Vitals Value Taken Time  BP 131/62 07/15/21 1010  Temp 35.9 C 07/15/21 1010  Pulse 81 07/15/21 1012  Resp 16 07/15/21 1012  SpO2 100 % 07/15/21 1012  Vitals shown include unvalidated device data.  Last Pain:  Vitals:   07/15/21 1010  TempSrc: Temporal  PainSc: Asleep         Complications: No notable events documented.

## 2021-07-18 ENCOUNTER — Encounter: Payer: Self-pay | Admitting: Gastroenterology

## 2021-07-18 LAB — SURGICAL PATHOLOGY

## 2021-07-22 ENCOUNTER — Telehealth: Payer: Self-pay

## 2021-07-22 NOTE — Telephone Encounter (Signed)
Called pt to schedule hysteroscopy d&c with Staebler. She asked if I could provide her with a cost. I told her that I could get that info and let her know. She said the reason she was asking is because she recently had a colonoscopy and received a bill for $2000. She would rather wait until the new year if Dr Georgianne Fick feels that would be medically ok. I told her I would ask him and follow up with her next week.

## 2021-07-22 NOTE — Telephone Encounter (Signed)
-----   Message from Malachy Mood, MD sent at 07/11/2021 10:00 AM EST ----- Regarding: Surgery Surgery Booking Request Patient Full Name:  Kellie Obrien  MRN: [id number]  DOB: Jul 01, 1966  Surgeon: Malachy Mood, MD  Requested Surgery Date and Time: 1-4 weeks Primary Diagnosis AND Code: Postmenopausal bleeding N95.0 Secondary Diagnosis and Code:  Endometrial mass N94.89 Surgical Procedure: Hysteroscopy D&C RNFA Requested?: No L&D Notification: No Admission Status: same day surgery Length of Surgery: 50 min Special Case Needs: No H&P: No Phone Interview???:  Yes Interpreter: No Medical Clearance:  No Special Scheduling Instructions: No Any known health/anesthesia issues, diabetes, sleep apnea, latex allergy, defibrillator/pacemaker?: No Acuity: P2   (P1 highest, P2 delay may cause harm, P3 low, elective gyn, P4 lowest) Post op follow up visits: 1 week and 6 week

## 2021-07-23 NOTE — Telephone Encounter (Signed)
She can the concern is we can't say that it isn't an endometrial cancer without removing the polyp.  Also if it goes into next year it will likely need to be with another provider.  I don't know what her deductible is but it may still be cheaper for her to have surgery this year

## 2021-08-18 ENCOUNTER — Encounter: Payer: Self-pay | Admitting: Obstetrics and Gynecology

## 2021-09-14 ENCOUNTER — Other Ambulatory Visit: Payer: Self-pay

## 2021-09-14 ENCOUNTER — Ambulatory Visit: Payer: 59 | Admitting: Obstetrics and Gynecology

## 2021-09-14 ENCOUNTER — Encounter: Payer: Self-pay | Admitting: Obstetrics and Gynecology

## 2021-09-14 VITALS — BP 138/78 | Ht 61.0 in | Wt 236.8 lb

## 2021-09-14 DIAGNOSIS — R102 Pelvic and perineal pain: Secondary | ICD-10-CM

## 2021-09-14 DIAGNOSIS — N950 Postmenopausal bleeding: Secondary | ICD-10-CM | POA: Diagnosis not present

## 2021-09-14 NOTE — Progress Notes (Signed)
Patient ID: Kellie Obrien, female   DOB: 02-10-1966, 56 y.o.   MRN: [id number]  Reason for Consult: Gynecologic Exam   Referred by Rusty Aus, MD  Subjective:     HPI:  Kellie Obrien is a 56 y.o. female. She is here for a consultation regarding pelvic pain and postmenopausal bleeding. She reports that for several months she has had a burning pain followed by small brown spotting.   Pelvic US showed:  Indications:    Postmenopausal bleeding, pain Findings:  The uterus is anteverted and measures 9.7 x 6.2 x 4.8 cm. Echo texture is heterogenous with evidence of focal masses. Within the uterus is suspected fibroid measuring: Fibroid 1:   2.2 x 1.9 x 2.0  ...this is hypoechoic and is favored to be a submucosal fibroid, however, there is what could be a feeder vessel to an endometrial polyp seen on color doppler, with minimal internal flow seen.     The Endometrium is obscured and can not be visualized.   Ovaries are not visualized. Survey of the adnexa demonstrates no adnexal masses. There is no free fluid in the cul de sac.   Impression: 1. 2.2 cm hypoechoic mass mid fundus.     Gynecological History  No LMP recorded. Patient is postmenopausal.  Past Medical History:  Diagnosis Date   Allergic rhinitis    Asthma    Depression    Dermatitis    Diverticulosis    Hypertension    Hypothyroidism (acquired)    IBS (irritable bowel syndrome)    Vitamin D deficiency    Family History  Problem Relation Age of Onset   Esophageal cancer Brother    Breast cancer Neg Hx    Past Surgical History:  Procedure Laterality Date   CARPAL TUNNEL RELEASE     CESAREAN SECTION     COLONOSCOPY     COLONOSCOPY WITH PROPOFOL N/A 07/15/2021   Procedure: COLONOSCOPY WITH PROPOFOL;  Surgeon: Lesly Rubenstein, MD;  Location: [facility] ENDOSCOPY;  Service: Endoscopy;  Laterality: N/A;   FINGER SURGERY     OTHER SURGICAL HISTORY     Rectum Surgery    Short Social History:   Social History   Tobacco Use   Smoking status: Never   Smokeless tobacco: Never  Substance Use Topics   Alcohol use: Never    Allergies  Allergen Reactions   Codeine Hives and Itching   Venlafaxine Hives   Lisinopril Other (See Comments)    cough    Current Outpatient Medications  Medication Sig Dispense Refill   Cholecalciferol (VITAMIN D3) 50 MCG (2000 UT) capsule Take 2,000 Units by mouth daily.     cyanocobalamin 1000 MCG tablet Take by mouth.     gabapentin (NEURONTIN) 100 MG capsule Take 100 mg by mouth at bedtime as needed.     hydrochlorothiazide (HYDRODIURIL) 25 MG tablet Take 25 mg by mouth daily.     levocetirizine (XYZAL) 5 MG tablet Take 5 mg by mouth every evening.     levothyroxine (SYNTHROID) 150 MCG tablet Take 150 mcg by mouth every morning.     montelukast (SINGULAIR) 10 MG tablet Take 10 mg by mouth daily.     polyethylene glycol-electrolytes (NULYTELY) 420 g solution Take 4,000 mLs by mouth as directed.     TURMERIC PO Take by mouth.     XIIDRA 5 % SOLN Apply 1 drop to eye 2 (two) times daily.     losartan (COZAAR) 100 MG tablet Take 100  mg by mouth daily. (Patient not taking: Reported on 09/14/2021)     No current facility-administered medications for this visit.    Review of Systems  Constitutional: Negative for chills, fatigue, fever and unexpected weight change.  HENT: Negative for trouble swallowing.  Eyes: Negative for loss of vision.  Respiratory: Negative for cough, shortness of breath and wheezing.  Cardiovascular: Negative for chest pain, leg swelling, palpitations and syncope.  GI: Negative for abdominal pain, blood in stool, diarrhea, nausea and vomiting.  GU: Negative for difficulty urinating, dysuria, frequency and hematuria.  Musculoskeletal: Negative for back pain, leg pain and joint pain.  Skin: Negative for rash.  Neurological: Negative for dizziness, headaches, light-headedness, numbness and seizures.  Psychiatric: Negative for  behavioral problem, confusion, depressed mood and sleep disturbance.       Objective:  Objective   Vitals:   09/14/21 1512  BP: 138/78  Weight: 236 lb 12.8 oz (107.4 kg)  Height: 5\' 1"  (1.549 m)   Body mass index is 44.74 kg/m.  Physical Exam Vitals and nursing note reviewed. Exam conducted with a chaperone present.  Constitutional:      Appearance: Normal appearance.  HENT:     Head: Normocephalic and atraumatic.  Eyes:     Extraocular Movements: Extraocular movements intact.     Pupils: Pupils are equal, round, and reactive to light.  Cardiovascular:     Rate and Rhythm: Normal rate and regular rhythm.  Pulmonary:     Effort: Pulmonary effort is normal.     Breath sounds: Normal breath sounds.  Abdominal:     General: Abdomen is flat.     Palpations: Abdomen is soft.  Genitourinary:    Comments: Declined Musculoskeletal:     Cervical back: Normal range of motion.  Skin:    General: Skin is warm and dry.  Neurological:     General: No focal deficit present.     Mental Status: She is alert and oriented to person, place, and time.  Psychiatric:        Behavior: Behavior normal.        Thought Content: Thought content normal.        Judgment: Judgment normal.    Assessment/Plan:     56 yo with postmenopausal bleeding and pain Will plan hysteroscopy D&C to investigate bleeding and pain.  Discussed risks of bleeding, infection and damage to surrounding pelvic organs associated with surgery. Note sent to surgical scheduler.    More than 20 minutes were spent face to face with the patient in the room, reviewing the medical record, labs and images, and coordinating care for the patient. The plan of management was discussed in detail and counseling was provided.    Adrian Prows MD [facility] 09/14/2021 3:44 PM

## 2021-09-19 ENCOUNTER — Telehealth: Payer: Self-pay | Admitting: Psychiatry

## 2021-09-19 NOTE — Telephone Encounter (Signed)
Spoke with the patient and scheduled surgery for 11/01/21 (1/24 and 10/20/21 were also offered), Pre-admit Testing phone interview to be scheduled (patient will watch for MyChart notification), 1 week Post Op on 3/9 @ 2:35pm. Patient confirmed [employer]. Patient is aware someone will need to drive her on the day of surgery.

## 2021-10-24 ENCOUNTER — Other Ambulatory Visit: Payer: Self-pay

## 2021-10-24 ENCOUNTER — Other Ambulatory Visit
Admission: RE | Admit: 2021-10-24 | Discharge: 2021-10-24 | Disposition: A | Discharge status: Home / Self Care | Payer: No Typology Code available for payment source | Source: Ambulatory Visit | Attending: Obstetrics and Gynecology | Admitting: Obstetrics and Gynecology

## 2021-10-24 VITALS — Ht 61.0 in | Wt 230.0 lb

## 2021-10-24 DIAGNOSIS — I10 Essential (primary) hypertension: Secondary | ICD-10-CM

## 2021-10-24 DIAGNOSIS — Z01812 Encounter for preprocedural laboratory examination: Secondary | ICD-10-CM

## 2021-10-24 DIAGNOSIS — N950 Postmenopausal bleeding: Secondary | ICD-10-CM

## 2021-10-24 HISTORY — DX: Other complications of anesthesia, initial encounter: T88.59XA

## 2021-10-24 NOTE — Patient Instructions (Addendum)
Your procedure is scheduled on: Tuesday November 01, 2021. Report to Day Surgery inside [place] 2nd floor, stop by admissions desk before getting on elevator. To find out your arrival time please call [phone] between 1PM - 3PM on Monday October 31, 2021.  Remember: Instructions that are not followed completely may result in serious medical risk,  up to and including death, or upon the discretion of your surgeon and anesthesiologist your  surgery may need to be rescheduled.     _X__ 1. Do not eat food after midnight the night before your procedure.                 No chewing gum or hard candies. You may drink clear liquids up to 2 hours                 before you are scheduled to arrive for your surgery- DO not drink clear                 liquids within 2 hours of the start of your surgery.                 Clear Liquids include:  water, apple juice without pulp, clear Gatorade, G2 or                  Gatorade Zero (avoid Red/Purple/Blue), Black Coffee or Tea (Do not add                 anything to coffee or tea).  __X__2.  On the morning of surgery brush your teeth with toothpaste and water, you                may rinse your mouth with mouthwash if you wish.  Do not swallow any toothpaste or mouthwash.     _X__ 3.  No Alcohol for 24 hours before or after surgery.   _X__ 4.  Do Not Smoke or use e-cigarettes For 24 Hours Prior to Your Surgery.                 Do not use any chewable tobacco products for at least 6 hours prior to                 Surgery.  _X__  5.  Do not use any recreational drugs (marijuana, cocaine, heroin, ecstasy, MDMA or other)                For at least one week prior to your surgery.  Combination of these drugs with anesthesia                May have life threatening results.  ____  6.  Bring all medications with you on the day of surgery if instructed.   __X__  7.  Notify your doctor if there is any change in your medical  condition      (cold, fever, infections).     Do not wear jewelry, make-up, hairpins, clips or nail polish. Do not wear lotions, powders, or perfumes. You may wear deodorant. Do not shave 48 hours prior to surgery.  Do not bring valuables to the hospital.    [facility] is not responsible for any belongings or valuables.  Contacts, dentures or bridgework may not be worn into surgery. Leave your suitcase in the car. After surgery it may be brought to your room. For patients admitted to the hospital, discharge time is determined by your treatment team.  Patients discharged the day of surgery will not be allowed to drive home.   Make arrangements for someone to be with you for the first 24 hours of your Same Day Discharge.   __X__ Take these medicines the morning of surgery with A SIP OF WATER:    1. levothyroxine (SYNTHROID) 150 MCG tablet  2.   3.   4.  5.  6.  ____ Fleet Enema (as directed)   ____ Use CHG Soap (or wipes) as directed  ____ Use Benzoyl Peroxide Gel as instructed  ____ Use inhalers on the day of surgery  ____ Stop metformin 2 days prior to surgery    ____ Take 1/2 of usual insulin dose the night before surgery. No insulin the morning          of surgery.   ____ Call your PCP, cardiologist, or Pulmonologist if taking Coumadin/Plavix/aspirin and ask when to stop before your surgery.   __X__ One Week prior to surgery- Stop Anti-inflammatories such as Ibuprofen, Aleve, Advil, Motrin, meloxicam (MOBIC), diclofenac, etodolac, ketorolac, Toradol, Daypro, piroxicam, Goody's or BC powders. OK TO USE TYLENOL IF NEEDED   __X__ Stop ALL supplements until after surgery.    ____ Bring C-Pap to the hospital.    If you have any questions regarding your pre-procedure instructions,  Please call Pre-admit Testing at [phone]

## 2021-10-27 ENCOUNTER — Other Ambulatory Visit: Payer: Self-pay

## 2021-10-27 ENCOUNTER — Other Ambulatory Visit
Admission: RE | Admit: 2021-10-27 | Discharge: 2021-10-27 | Disposition: A | Discharge status: Home / Self Care | Payer: No Typology Code available for payment source | Source: Ambulatory Visit | Attending: Obstetrics and Gynecology | Admitting: Obstetrics and Gynecology

## 2021-10-27 DIAGNOSIS — I10 Essential (primary) hypertension: Secondary | ICD-10-CM | POA: Diagnosis not present

## 2021-10-27 DIAGNOSIS — N950 Postmenopausal bleeding: Secondary | ICD-10-CM | POA: Insufficient documentation

## 2021-10-27 DIAGNOSIS — Z01812 Encounter for preprocedural laboratory examination: Secondary | ICD-10-CM | POA: Diagnosis present

## 2021-10-27 LAB — BASIC METABOLIC PANEL
Anion gap: 11 (ref 5–15)
BUN: 23 mg/dL — ABNORMAL HIGH (ref 6–20)
CO2: 26 mmol/L (ref 22–32)
Calcium: 9.4 mg/dL (ref 8.9–10.3)
Chloride: 101 mmol/L (ref 98–111)
Creatinine, Ser: 0.91 mg/dL (ref 0.44–1.00)
GFR, Estimated: >60 mL/min (ref >60)
Glucose, Bld: 98 mg/dL (ref 70–99)
Potassium: 3.8 mmol/L (ref 3.5–5.1)
Sodium: 138 mmol/L (ref 135–145)

## 2021-10-27 LAB — CBC
HCT: 48.4 % — ABNORMAL HIGH (ref 36.0–46.0)
Hemoglobin: 15.7 g/dL — ABNORMAL HIGH (ref 12.0–15.0)
MCH: 27.8 pg (ref 26.0–34.0)
MCHC: 32.4 g/dL (ref 30.0–36.0)
MCV: 85.7 fL (ref 80.0–100.0)
Platelets: 202 10*3/uL (ref 150–400)
RBC: 5.65 MIL/uL — ABNORMAL HIGH (ref 3.87–5.11)
RDW: 13.2 % (ref 11.5–15.5)
WBC: 6.7 10*3/uL (ref 4.0–10.5)
nRBC: 0 % (ref 0.0–0.2)

## 2021-10-27 LAB — TYPE AND SCREEN
ABO/RH(D): A NEG
Antibody Screen: NEGATIVE

## 2021-10-31 MED ORDER — FAMOTIDINE 20 MG PO TABS: 20.0000 mg | ORAL_TABLET | Freq: Once | ORAL | Status: AC

## 2021-10-31 MED ORDER — ACETAMINOPHEN 500 MG PO TABS: 1000.0000 mg | ORAL_TABLET | Freq: Once | ORAL | Status: AC

## 2021-10-31 MED ORDER — LACTATED RINGERS IV SOLN
INTRAVENOUS | Status: DC
Start: 2021-10-31 — End: 2021-11-01

## 2021-10-31 MED ORDER — CHLORHEXIDINE GLUCONATE 0.12 % MT SOLN: 15.0000 mL | Freq: Once | OROMUCOSAL | Status: AC

## 2021-10-31 MED ORDER — ORAL CARE MOUTH RINSE: 15.0000 mL | Freq: Once | OROMUCOSAL | Status: AC

## 2021-10-31 NOTE — Anesthesia Preprocedure Evaluation (Addendum)
Anesthesia Evaluation  Patient identified by MRN, date of birth, ID band Patient awake    Reviewed: Allergy & Precautions, NPO status , Patient's Chart, lab work & pertinent test results  Airway Mallampati: III  TM Distance: >3 FB Neck ROM: full    Dental  (+) Teeth Intact   Pulmonary    Pulmonary exam normal  + decreased breath sounds      Cardiovascular Exercise Tolerance: Good METS: 3 - Mets hypertension, Pt. on medications + DOE  Normal cardiovascular exam Rhythm:Regular Rate:Normal     Neuro/Psych PSYCHIATRIC DISORDERS Depression negative neurological ROS     GI/Hepatic negative GI ROS, Neg liver ROS,   Endo/Other  Hypothyroidism Morbid obesity  Renal/GU negative Renal ROS  negative genitourinary   Musculoskeletal negative musculoskeletal ROS (+)   Abdominal (+) + obese,   Peds negative pediatric ROS (+)  Hematology negative hematology ROS (+)   Anesthesia Other Findings Past Medical History: No date: Allergic rhinitis No date: Asthma No date: Depression No date: Dermatitis No date: Diverticulosis No date: Hypertension No date: Hypothyroidism (acquired) No date: IBS (irritable bowel syndrome) No date: Vitamin D deficiency  Past Surgical History: No date: CARPAL TUNNEL RELEASE No date: CESAREAN SECTION No date: COLONOSCOPY No date: FINGER SURGERY No date: OTHER SURGICAL HISTORY     Comment:  Rectum Surgery  BMI    Body Mass Index: 47.69 kg/m      Reproductive/Obstetrics negative OB ROS                            Anesthesia Physical  Anesthesia Plan  ASA: 3  Anesthesia Plan: General   Post-op Pain Management: Tylenol PO (pre-op)* and Toradol IV (intra-op)*   Induction: Intravenous  PONV Risk Score and Plan: Propofol infusion and TIVA  Airway Management Planned: Oral ETT and LMA  Additional Equipment:   Intra-op Plan:   Post-operative Plan:    Informed Consent: I have reviewed the patients History and Physical, chart, labs and discussed the procedure including the risks, benefits and alternatives for the proposed anesthesia with the patient or authorized representative who has indicated his/her understanding and acceptance.     Dental advisory given  Plan Discussed with: CRNA and Surgeon  Anesthesia Plan Comments:        Anesthesia Quick Evaluation

## 2021-11-01 ENCOUNTER — Other Ambulatory Visit: Payer: Self-pay

## 2021-11-01 ENCOUNTER — Encounter: Payer: Self-pay | Admitting: Obstetrics and Gynecology

## 2021-11-01 ENCOUNTER — Ambulatory Visit: Payer: No Typology Code available for payment source | Admitting: Urgent Care

## 2021-11-01 ENCOUNTER — Ambulatory Visit: Payer: No Typology Code available for payment source | Admitting: Anesthesiology

## 2021-11-01 ENCOUNTER — Encounter
Admission: RE | Disposition: A | Discharge status: Home / Self Care | Payer: Self-pay | Source: Home / Self Care | Attending: Obstetrics and Gynecology

## 2021-11-01 ENCOUNTER — Ambulatory Visit
Admission: RE | Admit: 2021-11-01 | Discharge: 2021-11-01 | Disposition: A | Discharge status: Home / Self Care | Payer: No Typology Code available for payment source | Attending: Obstetrics and Gynecology | Admitting: Obstetrics and Gynecology

## 2021-11-01 DIAGNOSIS — Z79899 Other long term (current) drug therapy: Secondary | ICD-10-CM | POA: Diagnosis not present

## 2021-11-01 DIAGNOSIS — N950 Postmenopausal bleeding: Secondary | ICD-10-CM | POA: Insufficient documentation

## 2021-11-01 DIAGNOSIS — Z6841 Body Mass Index (BMI) 40.0 and over, adult: Secondary | ICD-10-CM | POA: Diagnosis not present

## 2021-11-01 DIAGNOSIS — D259 Leiomyoma of uterus, unspecified: Secondary | ICD-10-CM

## 2021-11-01 DIAGNOSIS — J45909 Unspecified asthma, uncomplicated: Secondary | ICD-10-CM | POA: Insufficient documentation

## 2021-11-01 DIAGNOSIS — R102 Pelvic and perineal pain: Secondary | ICD-10-CM

## 2021-11-01 DIAGNOSIS — N840 Polyp of corpus uteri: Secondary | ICD-10-CM | POA: Insufficient documentation

## 2021-11-01 DIAGNOSIS — E039 Hypothyroidism, unspecified: Secondary | ICD-10-CM | POA: Diagnosis not present

## 2021-11-01 DIAGNOSIS — Z79890 Hormone replacement therapy (postmenopausal): Secondary | ICD-10-CM | POA: Diagnosis not present

## 2021-11-01 DIAGNOSIS — F32A Depression, unspecified: Secondary | ICD-10-CM | POA: Insufficient documentation

## 2021-11-01 DIAGNOSIS — K589 Irritable bowel syndrome without diarrhea: Secondary | ICD-10-CM | POA: Diagnosis not present

## 2021-11-01 DIAGNOSIS — I10 Essential (primary) hypertension: Secondary | ICD-10-CM | POA: Insufficient documentation

## 2021-11-01 HISTORY — PX: DILATATION & CURETTAGE/HYSTEROSCOPY WITH MYOSURE: SHX6511

## 2021-11-01 LAB — POCT PREGNANCY, URINE: Preg Test, Ur: NEGATIVE

## 2021-11-01 LAB — ABO/RH: ABO/RH(D): A NEG

## 2021-11-01 SURGERY — DILATATION & CURETTAGE/HYSTEROSCOPY WITH MYOSURE
Anesthesia: General

## 2021-11-01 MED ORDER — PROPOFOL 10 MG/ML IV BOLUS
INTRAVENOUS | Status: AC
  Filled 2021-11-01: qty 20

## 2021-11-01 MED ORDER — FAMOTIDINE 20 MG PO TABS
ORAL_TABLET | ORAL | Status: AC
Start: 2021-11-01 — End: 2021-11-01
  Administered 2021-11-01: 20 mg via ORAL
  Filled 2021-11-01: qty 1

## 2021-11-01 MED ORDER — CHLORHEXIDINE GLUCONATE 0.12 % MT SOLN
OROMUCOSAL | Status: AC
  Administered 2021-11-01: 15 mL via OROMUCOSAL
  Filled 2021-11-01: qty 15

## 2021-11-01 MED ORDER — LIDOCAINE HCL (CARDIAC) PF 100 MG/5ML IV SOSY
PREFILLED_SYRINGE | INTRAVENOUS | Status: DC | PRN
  Administered 2021-11-01: 100 mg via INTRAVENOUS

## 2021-11-01 MED ORDER — LACTATED RINGERS IV SOLN: INTRAVENOUS | Status: DC

## 2021-11-01 MED ORDER — SUCCINYLCHOLINE CHLORIDE 200 MG/10ML IV SOSY
PREFILLED_SYRINGE | INTRAVENOUS | Status: DC | PRN
  Administered 2021-11-01: 120 mg via INTRAVENOUS

## 2021-11-01 MED ORDER — DEXAMETHASONE SODIUM PHOSPHATE 10 MG/ML IJ SOLN
INTRAMUSCULAR | Status: AC
  Filled 2021-11-01: qty 1

## 2021-11-01 MED ORDER — SODIUM CHLORIDE 0.9 % IR SOLN
Status: DC | PRN
  Administered 2021-11-01: 631 mL

## 2021-11-01 MED ORDER — ONDANSETRON HCL 4 MG/2ML IJ SOLN
INTRAMUSCULAR | Status: DC | PRN
Start: 2021-11-01 — End: 2021-11-01
  Administered 2021-11-01: 4 mg via INTRAVENOUS

## 2021-11-01 MED ORDER — PROPOFOL 10 MG/ML IV BOLUS
INTRAVENOUS | Status: DC | PRN
  Administered 2021-11-01: 50 mg via INTRAVENOUS
  Administered 2021-11-01: 150 mg via INTRAVENOUS

## 2021-11-01 MED ORDER — MIDAZOLAM HCL 2 MG/2ML IJ SOLN
INTRAMUSCULAR | Status: AC
  Filled 2021-11-01: qty 2

## 2021-11-01 MED ORDER — DEXAMETHASONE SODIUM PHOSPHATE 10 MG/ML IJ SOLN
INTRAMUSCULAR | Status: DC | PRN
Start: 2021-11-01 — End: 2021-11-01
  Administered 2021-11-01: 10 mg via INTRAVENOUS

## 2021-11-01 MED ORDER — FENTANYL CITRATE (PF) 100 MCG/2ML IJ SOLN
INTRAMUSCULAR | Status: AC
  Filled 2021-11-01: qty 2

## 2021-11-01 MED ORDER — LIDOCAINE HCL (PF) 2 % IJ SOLN
INTRAMUSCULAR | Status: AC
  Filled 2021-11-01: qty 5

## 2021-11-01 MED ORDER — ACETAMINOPHEN 500 MG PO TABS
ORAL_TABLET | ORAL | Status: AC
Start: 2021-11-01 — End: 2021-11-01
  Administered 2021-11-01: 1000 mg via ORAL
  Filled 2021-11-01: qty 2

## 2021-11-01 MED ORDER — ONDANSETRON HCL 4 MG/2ML IJ SOLN
INTRAMUSCULAR | Status: AC
  Filled 2021-11-01: qty 2

## 2021-11-01 MED ORDER — FENTANYL CITRATE (PF) 100 MCG/2ML IJ SOLN
INTRAMUSCULAR | Status: DC | PRN
  Administered 2021-11-01 (×2): 50 ug via INTRAVENOUS

## 2021-11-01 MED ORDER — POVIDONE-IODINE 10 % EX SWAB
2.0000 "application " | Freq: Once | CUTANEOUS | Status: AC
  Administered 2021-11-01: 2 via TOPICAL

## 2021-11-01 MED ORDER — FENTANYL CITRATE (PF) 100 MCG/2ML IJ SOLN
INTRAMUSCULAR | Status: AC
Start: 2021-11-01 — End: ?
  Filled 2021-11-01: qty 2

## 2021-11-01 MED ORDER — IBUPROFEN 600 MG PO TABS: 600.0000 mg | ORAL_TABLET | Freq: Four times a day (QID) | ORAL | 0 refills | Status: AC | PRN

## 2021-11-01 SURGICAL SUPPLY — 33 items
BACTOSHIELD CHG 4% 4OZ (MISCELLANEOUS) ×1
DEVICE MYOSURE LITE (MISCELLANEOUS) IMPLANT
DEVICE MYOSURE REACH (MISCELLANEOUS) ×1 IMPLANT
DRSG TELFA 3X8 NADH (GAUZE/BANDAGES/DRESSINGS) ×2 IMPLANT
ELECT LEEP BALL 5MM 12CM (MISCELLANEOUS) ×2
ELECT LEEP LOOP 20X10 R2010 (MISCELLANEOUS) ×2
ELECT LOOP 1.0X1.0CM R1010 (MISCELLANEOUS) ×2
ELECT REM PT RETURN 9FT ADLT (ELECTROSURGICAL)
ELECTRODE LEEP BALL 5MM 12CM (MISCELLANEOUS) IMPLANT
ELECTRODE LEP LOOP 20X10 R2010 (MISCELLANEOUS) IMPLANT
ELECTRODE LOOP 1.0X1.0CM R1010 (MISCELLANEOUS) IMPLANT
ELECTRODE REM PT RTRN 9FT ADLT (ELECTROSURGICAL) IMPLANT
GAUZE 4X4 16PLY ~~LOC~~+RFID DBL (SPONGE) ×6 IMPLANT
GLOVE SURG SYN 6.5 ES PF (GLOVE) ×2 IMPLANT
GLOVE SURG SYN 6.5 PF PI (GLOVE) ×2 IMPLANT
GLOVE SURG UNDER POLY LF SZ6.5 (GLOVE) ×6 IMPLANT
GOWN STRL REUS W/ TWL LRG LVL3 (GOWN DISPOSABLE) ×4 IMPLANT
GOWN STRL REUS W/TWL LRG LVL3 (GOWN DISPOSABLE) ×4
IV NS IRRIG 3000ML ARTHROMATIC (IV SOLUTION) ×3 IMPLANT
KIT PROCEDURE FLUENT (KITS) ×1 IMPLANT
MANIFOLD NEPTUNE II (INSTRUMENTS) ×2 IMPLANT
PACK DNC HYST (MISCELLANEOUS) ×3 IMPLANT
PAD DRESSING TELFA 3X8 NADH (GAUZE/BANDAGES/DRESSINGS) IMPLANT
PAD OB MATERNITY 4.3X12.25 (PERSONAL CARE ITEMS) ×3 IMPLANT
PAD PREP 24X41 OB/GYN DISP (PERSONAL CARE ITEMS) ×3 IMPLANT
SCRUB CHG 4% DYNA-HEX 4OZ (MISCELLANEOUS) ×2 IMPLANT
SEAL ROD LENS SCOPE MYOSURE (ABLATOR) ×3 IMPLANT
SET CYSTO W/LG BORE CLAMP LF (SET/KITS/TRAYS/PACK) IMPLANT
SUT VIC AB 0 CT1 36 (SUTURE) ×1 IMPLANT
SUT VIC AB 2-0 CT1 27 (SUTURE) ×2
SUT VIC AB 2-0 CT1 TAPERPNT 27 (SUTURE) IMPLANT
TOWEL OR 17X26 4PK STRL BLUE (TOWEL DISPOSABLE) ×3 IMPLANT
WATER STERILE IRR 500ML POUR (IV SOLUTION) ×3 IMPLANT

## 2021-11-01 NOTE — Anesthesia Procedure Notes (Addendum)
Procedure Name: LMA Insertion Date/Time: 11/01/2021 8:58 AM Performed by: Loletha Grayer, CRNA Pre-anesthesia Checklist: Patient identified, Patient being monitored, Timeout performed, Emergency Drugs available and Suction available Patient Re-evaluated:Patient Re-evaluated prior to induction Oxygen Delivery Method: Circle system utilized Preoxygenation: Pre-oxygenation with 100% oxygen Induction Type: IV induction Ventilation: Mask ventilation without difficulty LMA: LMA inserted LMA Size: 4.0 Number of attempts: 1 Tube secured with: Tape Dental Injury: Teeth and Oropharynx as per pre-operative assessment  Comments: #4 LMA placed. Poor etCO2 exchange. Replaced with 3.5 AirQ-poor exchange as well. Elected to intubate.

## 2021-11-01 NOTE — Transfer of Care (Signed)
Immediate Anesthesia Transfer of Care Note  Patient: Kellie Obrien  Procedure(s) Performed: [place] POLYPECTOMY  Patient Location: PACU  Anesthesia Type:General  Level of Consciousness: awake  Airway & Oxygen Therapy: Patient Spontanous Breathing and Patient connected to face mask oxygen  Post-op Assessment: Report given to RN and Post -op Vital signs reviewed and stable  Post vital signs: Reviewed and stable  Last Vitals:  Vitals Value Taken Time  BP 135/75 11/01/21 0950  Temp 36.1 C 11/01/21 0950  Pulse 80 11/01/21 0951  Resp 17 11/01/21 0951  SpO2 100 % 11/01/21 0951  Vitals shown include unvalidated device data.  Last Pain:  Vitals:   11/01/21 0757  TempSrc: Temporal      Patients Stated Pain Goal: 0 (45/80/99 8338)  Complications: No notable events documented.

## 2021-11-01 NOTE — Discharge Instructions (Signed)

## 2021-11-01 NOTE — Anesthesia Postprocedure Evaluation (Signed)
Anesthesia Post Note  Patient: Kellie Obrien  Procedure(s) Performed: [place] POLYPECTOMY  Patient location during evaluation: Endoscopy Anesthesia Type: General Level of consciousness: awake and alert Pain management: pain level controlled Vital Signs Assessment: post-procedure vital signs reviewed and stable Respiratory status: spontaneous breathing, nonlabored ventilation and respiratory function stable Cardiovascular status: blood pressure returned to baseline and stable Postop Assessment: no apparent nausea or vomiting Anesthetic complications: no   No notable events documented.   Last Vitals:  Vitals:   11/01/21 1015 11/01/21 1026  BP: 131/74 (!) 153/74  Pulse: 70 66  Resp: 12 15  Temp: 36.6 C (!) 36.4 C  SpO2: 95% 95%    Last Pain:  Vitals:   11/01/21 1026  TempSrc: Temporal  PainSc: 0-No pain                 [place] Ouch

## 2021-11-01 NOTE — H&P (Signed)
Kellie Obrien is an 56 y.o. female.   Chief Complaint: post menopausal bleeding HPI: Kellie Obrien is a 56 y.o. female. She is here for a consultation regarding pelvic pain and postmenopausal bleeding. She reports that for several months she has had a burning pain followed by small brown spotting.    Pelvic US showed:   Indications:    Postmenopausal bleeding, pain Findings:  The uterus is anteverted and measures 9.7 x 6.2 x 4.8 cm. Echo texture is heterogenous with evidence of focal masses. Within the uterus is suspected fibroid measuring: Fibroid 1:   2.2 x 1.9 x 2.0  ...this is hypoechoic and is favored to be a submucosal fibroid, however, there is what could be a feeder vessel to an endometrial polyp seen on color doppler, with minimal internal flow seen.     The Endometrium is obscured and can not be visualized.   Ovaries are not visualized. Survey of the adnexa demonstrates no adnexal masses. There is no free fluid in the cul de sac.   Impression: 1. 2.2 cm hypoechoic mass mid fundus.      Gynecological History   No LMP recorded. Patient is postmenopausal.  Past Medical History:  Diagnosis Date   Allergic rhinitis    Asthma    Complication of anesthesia    Depression    Dermatitis    Diverticulosis    Hypertension    Hypothyroidism (acquired)    IBS (irritable bowel syndrome)    Vitamin D deficiency     Past Surgical History:  Procedure Laterality Date   CARPAL TUNNEL RELEASE     CESAREAN SECTION     COLONOSCOPY     COLONOSCOPY WITH PROPOFOL N/A 07/15/2021   Procedure: COLONOSCOPY WITH PROPOFOL;  Surgeon: Lesly Rubenstein, MD;  Location: [facility] ENDOSCOPY;  Service: Endoscopy;  Laterality: N/A;   FINGER SURGERY     OTHER SURGICAL HISTORY     Fistula Rectum surgery    Family History  Problem Relation Age of Onset   Esophageal cancer Brother    Breast cancer Neg Hx    Social History:  reports that she has never smoked. She has never used  smokeless tobacco. She reports that she does not drink alcohol and does not use drugs.  Allergies:  Allergies  Allergen Reactions   Codeine Hives and Itching   Venlafaxine Hives   Tape Itching   Lisinopril Other (See Comments)    cough    Medications Prior to Admission  Medication Sig Dispense Refill   Cholecalciferol (VITAMIN D3) 50 MCG (2000 UT) capsule Take 2,000 Units by mouth daily.     fluticasone (FLONASE) 50 MCG/ACT nasal spray Place 1 spray into both nostrils daily.     levocetirizine (XYZAL) 5 MG tablet Take 5 mg by mouth every evening.     levothyroxine (SYNTHROID) 200 MCG tablet Take 200 mcg by mouth daily before breakfast.     montelukast (SINGULAIR) 10 MG tablet Take 10 mg by mouth daily.     TURMERIC PO Take 1 capsule by mouth in the morning and at bedtime.     verapamil (CALAN-SR) 240 MG CR tablet Take 240 mg by mouth at bedtime.     vitamin B-12 (CYANOCOBALAMIN) 500 MCG tablet Take 500 mcg by mouth daily.     XIIDRA 5 % SOLN Place 1 drop into both eyes daily.     gabapentin (NEURONTIN) 100 MG capsule Take 100 mg by mouth at bedtime as needed (pain). (Patient not taking:  Reported on 11/01/2021)     hydrochlorothiazide (HYDRODIURIL) 25 MG tablet Take 25 mg by mouth daily as needed (fluid). (Patient not taking: Reported on 11/01/2021)      Results for orders placed or performed during the hospital encounter of 11/01/21 (from the past 48 hour(s))  ABO/Rh     Status: None   Collection Time: 11/01/21  7:32 AM  Result Value Ref Range   ABO/RH(D)      A NEG Performed at [facility], [address] [place], [place] [address]   Pregnancy, urine POC     Status: None   Collection Time: 11/01/21  7:47 AM  Result Value Ref Range   Preg Test, Ur NEGATIVE NEGATIVE    Comment:        THE SENSITIVITY OF THIS METHODOLOGY IS >24 mIU/mL    No results found.  Review of Systems  Constitutional:  Negative for chills and fever.  HENT:  Negative for congestion,  hearing loss and sinus pain.   Respiratory:  Negative for cough, shortness of breath and wheezing.   Cardiovascular:  Negative for chest pain, palpitations and leg swelling.  Gastrointestinal:  Negative for abdominal pain, constipation, diarrhea, nausea and vomiting.  Genitourinary:  Negative for dysuria, flank pain, frequency, hematuria and urgency.  Musculoskeletal:  Negative for back pain.  Skin:  Negative for rash.  Neurological:  Negative for dizziness and headaches.  Psychiatric/Behavioral:  Negative for suicidal ideas. The patient is not nervous/anxious.    Blood pressure (!) 163/75, pulse 88, temperature 98.3 F (36.8 C), temperature source Temporal, resp. rate 16, height 5\' 1"  (1.549 m), weight 104.3 kg, SpO2 98 %. Physical Exam Vitals and nursing note reviewed.  Constitutional:      Appearance: Normal appearance. She is well-developed.  HENT:     Head: Normocephalic and atraumatic.  Cardiovascular:     Rate and Rhythm: Normal rate and regular rhythm.  Pulmonary:     Effort: Pulmonary effort is normal.     Breath sounds: Normal breath sounds.  Abdominal:     General: Bowel sounds are normal.     Palpations: Abdomen is soft.  Musculoskeletal:        General: Normal range of motion.  Skin:    General: Skin is warm and dry.  Neurological:     Mental Status: She is alert and oriented to person, place, and time.  Psychiatric:        Behavior: Behavior normal.        Thought Content: Thought content normal.        Judgment: Judgment normal.     Assessment/Plan 56 yo with postmenopausal bleeding and pain Here for hysteroscopy D&C to investigate bleeding and pain.  Discussed risks of bleeding, infection and damage to surrounding pelvic organs associated with surgery.All questions answered. Consents signed. Will proceed with the procedure.   Homero Fellers, MD 11/01/2021, 8:36 AM

## 2021-11-01 NOTE — Anesthesia Procedure Notes (Signed)
Procedure Name: Intubation Date/Time: 11/01/2021 9:03 AM Performed by: Loletha Grayer, CRNA Pre-anesthesia Checklist: Patient identified, Patient being monitored, Timeout performed, Emergency Drugs available and Suction available Patient Re-evaluated:Patient Re-evaluated prior to induction Oxygen Delivery Method: Circle system utilized Preoxygenation: Pre-oxygenation with 100% oxygen Induction Type: IV induction Ventilation: Mask ventilation without difficulty Laryngoscope Size: 3 and McGraph Grade View: Grade I Tube type: Oral Tube size: 7.0 mm Number of attempts: 1 Airway Equipment and Method: Stylet Placement Confirmation: ETT inserted through vocal cords under direct vision, positive ETCO2 and breath sounds checked- equal and bilateral Secured at: 22 cm Tube secured with: Tape Dental Injury: Teeth and Oropharynx as per pre-operative assessment

## 2021-11-01 NOTE — Op Note (Signed)
Operative Note  11/01/2021  PRE-OP DIAGNOSIS: Postmenopausal bleeding, endometrial polyp  POST-OP DIAGNOSIS: same   SURGEON: Jennaya Pogue MD  PROCEDURE: Procedure(s): DILATATION & CURETTAGE/HYSTEROSCOPY WITH MYOSURE POLYPECTOMY   ANESTHESIA: Choice   ESTIMATED BLOOD LOSS: 5 cc   SPECIMENS:  Endometrial polyp and curetting   FLUID DEFICIT: minimal  COMPLICATIONS: None  DISPOSITION: PACU - hemodynamically stable.  CONDITION: stable  FINDINGS: Exam under anesthesia revealed  8 cm uterus with bilateral adnexa without masses or fullness. Hysteroscopy revealed an endometrial polyp and normal uterine cavity with bilateral tubal ostia and normal appearing endocervical canal.  PROCEDURE IN DETAIL: After informed consent was obtained, the patient was taken to the operating room where anesthesia was obtained without difficulty. The patient was positioned in the dorsal lithotomy position in [place]. The patient's bladder was catheterized with an in and out foley catheter. The patient was examined under anesthesia, with the above noted findings. The weightedspeculum was placed inside the patient's vagina, and the the anterior lip of the cervix was seen and grasped with the tenaculum.  The uterine cavity was sounded to 8 cm, and then the cervix was progressively dilated to a 18 French-Pratt dilator. The 0 degree hysteroscope was introduced, with saline fluid used to distend the intrauterine cavity, with the above noted findings.  The Myosure was used to remove the uterine polyp. Once the cavity was sampled entirely the hysteroscope was removed.   The uterine cavity was curetted until a gritty texture was noted, yielding endometrial curettings. Excellent hemostasis was noted, and all instruments were removed, with excellent hemostasis noted throughout. She was then taken out of dorsal lithotomy. Minimal discrepancy in fluid was noted.  The patient tolerated the procedure well. Sponge,  lap and needle counts were correct x2. The patient was taken to recovery room in excellent condition.  Adrian Prows MD [facility], [facility] 11/01/2021 9:47 AM

## 2021-11-02 LAB — SURGICAL PATHOLOGY

## 2021-11-10 ENCOUNTER — Ambulatory Visit (INDEPENDENT_AMBULATORY_CARE_PROVIDER_SITE_OTHER): Payer: No Typology Code available for payment source | Admitting: Obstetrics and Gynecology

## 2021-11-10 ENCOUNTER — Other Ambulatory Visit: Payer: Self-pay | Admitting: Obstetrics and Gynecology

## 2021-11-10 ENCOUNTER — Encounter: Payer: Self-pay | Admitting: Obstetrics and Gynecology

## 2021-11-10 ENCOUNTER — Other Ambulatory Visit: Payer: Self-pay

## 2021-11-10 VITALS — BP 148/80 | Ht 61.0 in | Wt 237.0 lb

## 2021-11-10 DIAGNOSIS — N905 Atrophy of vulva: Secondary | ICD-10-CM

## 2021-11-10 DIAGNOSIS — Z4889 Encounter for other specified surgical aftercare: Secondary | ICD-10-CM

## 2021-11-10 MED ORDER — PREMARIN 0.625 MG/GM VA CREA: TOPICAL_CREAM | VAGINAL | 8 refills | Status: DC

## 2021-11-10 NOTE — Progress Notes (Signed)
?  Postoperative Follow-up ?Patient presents post op from  Hysteroscopy with Myosure Polypectomy  for  endometrial polyp and postmenopausal bleeding , 1 week ago. ? ?Subjective: ?She reports that since the surgery she has been feeling well. No complaints.  ?Patient reports some improvement in her preop symptoms. Eating a regular diet without difficulty.  ?The patient is not having any pain.   ?Activity: normal activities of daily living.  ? ?Objective: ?BP (!) 148/80   Ht '5\' 1"'$  (1.549 m)   Wt 237 lb (107.5 kg)   BMI 44.78 kg/m?  ?Physical Exam ?Constitutional:   ?   Appearance: She is well-developed.  ?Genitourinary:  ?   Genitourinary Comments: External: Atrophic, erythematous vulva. No lesions noted.  ?  ?HENT:  ?   Head: Normocephalic and atraumatic.  ?Neck:  ?   Thyroid: No thyromegaly.  ?Cardiovascular:  ?   Rate and Rhythm: Normal rate and regular rhythm.  ?   Heart sounds: Normal heart sounds.  ?Pulmonary:  ?   Effort: Pulmonary effort is normal.  ?   Breath sounds: Normal breath sounds.  ?Abdominal:  ?   General: Bowel sounds are normal. There is no distension.  ?   Palpations: Abdomen is soft. There is no mass.  ?Musculoskeletal:  ?   Cervical back: Neck supple.  ?Neurological:  ?   Mental Status: She is alert and oriented to person, place, and time.  ?Skin: ?   General: Skin is warm and dry.  ?Psychiatric:     ?   Behavior: Behavior normal.     ?   Thought Content: Thought content normal.     ?   Judgment: Judgment normal.  ?Vitals reviewed.  ? ? ?Assessment: ?s/p :  Hysteroscopy with Myosure Polypectomy stable ? ?Plan: ? ?Patient has done well after surgery with no apparent complications.  ? ? ? ?I have discussed the post-operative course to date, and the expected progress moving forward.  The patient understands what complications to be concerned about.  I will see the patient in routine follow up, or sooner if needed.   ? ?Activity plan: No restriction..   ? ? ?Adrian Prows MD, ?[place],  [place] ?11/10/2021 ?2:36 PM ? ? ? ?

## 2023-04-26 ENCOUNTER — Other Ambulatory Visit: Payer: Self-pay

## 2023-04-26 ENCOUNTER — Emergency Department: Payer: No Typology Code available for payment source

## 2023-04-26 ENCOUNTER — Emergency Department
Admission: EM | Admit: 2023-04-26 | Discharge: 2023-04-26 | Disposition: A | Discharge status: Home / Self Care | Payer: No Typology Code available for payment source | Attending: Emergency Medicine | Admitting: Emergency Medicine

## 2023-04-26 DIAGNOSIS — S42214A Unspecified nondisplaced fracture of surgical neck of right humerus, initial encounter for closed fracture: Secondary | ICD-10-CM | POA: Diagnosis not present

## 2023-04-26 DIAGNOSIS — I10 Essential (primary) hypertension: Secondary | ICD-10-CM | POA: Diagnosis not present

## 2023-04-26 DIAGNOSIS — E039 Hypothyroidism, unspecified: Secondary | ICD-10-CM | POA: Diagnosis not present

## 2023-04-26 DIAGNOSIS — S4991XA Unspecified injury of right shoulder and upper arm, initial encounter: Secondary | ICD-10-CM | POA: Diagnosis present

## 2023-04-26 DIAGNOSIS — J45909 Unspecified asthma, uncomplicated: Secondary | ICD-10-CM | POA: Insufficient documentation

## 2023-04-26 DIAGNOSIS — W108XXA Fall (on) (from) other stairs and steps, initial encounter: Secondary | ICD-10-CM | POA: Diagnosis not present

## 2023-04-26 DIAGNOSIS — W19XXXA Unspecified fall, initial encounter: Secondary | ICD-10-CM

## 2023-04-26 MED ORDER — ONDANSETRON 4 MG PO TBDP: 4.0000 mg | ORAL_TABLET | Freq: Three times a day (TID) | ORAL | 0 refills | Status: AC | PRN

## 2023-04-26 MED ORDER — OXYCODONE-ACETAMINOPHEN 5-325 MG PO TABS: 1.0000 | ORAL_TABLET | Freq: Three times a day (TID) | ORAL | 0 refills | Status: AC | PRN

## 2023-04-26 MED ORDER — OXYCODONE-ACETAMINOPHEN 5-325 MG PO TABS
1.0000 | ORAL_TABLET | Freq: Once | ORAL | Status: AC
  Administered 2023-04-26: 1 via ORAL
  Filled 2023-04-26: qty 1

## 2023-04-26 MED ORDER — ONDANSETRON 4 MG PO TBDP
4.0000 mg | ORAL_TABLET | Freq: Once | ORAL | Status: AC
  Administered 2023-04-26: 4 mg via ORAL
  Filled 2023-04-26: qty 1

## 2023-04-26 NOTE — ED Provider Notes (Signed)
[facility] Emergency Department Provider Note     Event Date/Time   First MD Initiated Contact with Patient 04/26/23 2129     (approximate)   History   Shoulder Pain and Knee Pain   HPI  Kellie Obrien is a 57 y.o. female with a history of HTN, asthma, hypothyroidism, and IBS, presents to the ED following a mechanical fall.  Patient reports she fell going up the steps, landing injuring her right shoulder and right knee.  She denies any head injury or LOC.  Patient endorses that she did not take her blood pressure medicine prior to arrival in the ED.  No reports of any chest pain or shortness of breath.  Physical Exam   Triage Vital Signs: ED Triage Vitals  Encounter Vitals Group     BP 04/26/23 2112 (!) 211/64     Systolic BP Percentile --      Diastolic BP Percentile --      Pulse Rate 04/26/23 2112 87     Resp 04/26/23 2112 18     Temp 04/26/23 2112 98.7 F (37.1 C)     Temp Source 04/26/23 2112 Oral     SpO2 04/26/23 2112 97 %     Weight 04/26/23 2113 240 lb (108.9 kg)     Height 04/26/23 2113 5\' 1"  (1.549 m)     Head Circumference --      Peak Flow --      Pain Score 04/26/23 2113 8     Pain Loc --      Pain Education --      Exclude from Growth Chart --     Most recent vital signs: Vitals:   04/26/23 2112 04/26/23 2337  BP: (!) 211/64 (!) 163/69  Pulse: 87 88  Resp: 18 18  Temp: 98.7 F (37.1 C)   SpO2: 97% 98%    General Awake, no distress. NAD HEENT NCAT. PERRL. EOMI. No rhinorrhea. Mucous membranes are moist.  CV:  Good peripheral perfusion. RRR RESP:  Normal effort.  ABD:  No distention. Soft, nontender MSK:  Right shoulder without obvious deformity, dislocation, or sulcus sign.  Patient holding the shoulder in an abducted position for comfort.  She has normal composite fist distally.  Decreased active range of motion secondary to pain NEURO: CN II-XII grossly intact   ED Results / Procedures / Treatments    Labs (all labs ordered are listed, but only abnormal results are displayed) Labs Reviewed - No data to display   EKG  RADIOLOGY  I personally viewed and evaluated these images as part of my medical decision making, as well as reviewing the written report by the radiologist.  ED Provider Interpretation: Closed, comminuted fracture of the surgical neck of the right humerus  DG Knee Complete 4 Views Left  Result Date: 04/26/2023 CLINICAL DATA:  Left knee pain after fall EXAM: LEFT KNEE - COMPLETE 4+ VIEW COMPARISON:  None Available. FINDINGS: No evidence of fracture, dislocation, or joint effusion. Patellar enthesophytes. Soft tissues are unremarkable. IMPRESSION: No acute fracture or dislocation. Electronically Signed   By: Minerva Fester M.D.   On: 04/26/2023 22:29   DG Shoulder Right  Result Date: 04/26/2023 CLINICAL DATA:  Mechanical fall with subsequent shoulder pain EXAM: RIGHT SHOULDER - 2+ VIEW COMPARISON:  None Available. FINDINGS: Acute mildly displaced fracture of the surgical neck of the right humerus. The fracture extends into the greater tuberosity. No dislocation. IMPRESSION: Acute surgical neck fracture of the  right humerus. Electronically Signed   By: Minerva Fester M.D.   On: 04/26/2023 22:28     PROCEDURES:  Critical Care performed: No  Procedures Shoulder sling Ice pack  MEDICATIONS ORDERED IN ED: Medications  ondansetron (ZOFRAN-ODT) disintegrating tablet 4 mg (4 mg Oral Given 04/26/23 2315)  oxyCODONE-acetaminophen (PERCOCET/ROXICET) 5-325 MG per tablet 1 tablet (1 tablet Oral Given 04/26/23 2314)     IMPRESSION / MDM / ASSESSMENT AND PLAN / ED COURSE  I reviewed the triage vital signs and the nursing notes.                              Differential diagnosis includes, but is not limited to, knee fracture, knee contusion, knee sprain, knee effusion, knee dislocation, shoulder strain, shoulder fracture, shoulder dislocation  Patient's presentation is  most consistent with acute complicated illness / injury requiring diagnostic workup.  S/W Dr. Audelia Acton (Ortho): he will see the patient in the office next week.   Patient's diagnosis is consistent with mechanical fall resulting in a closed right humeral neck fracture without evidence of dislocation, based on interpretation of images.  Patient's left knee is negative for any acute fracture or dislocation.  She replaced in an appropriate sling immobilizer, and discharged with prescriptions for pain medicines nausea medicine. Patient is to follow up with orthopedics as discussed, as needed or otherwise directed.  Work and leisure activities limited by patient is given ED precautions to return to the ED for any worsening or new symptoms.   FINAL CLINICAL IMPRESSION(S) / ED DIAGNOSES   Final diagnoses:  Fall, initial encounter  Closed nondisplaced fracture of surgical neck of right humerus, unspecified fracture morphology, initial encounter     Rx / DC Orders   ED Discharge Orders          Ordered    ondansetron (ZOFRAN-ODT) 4 MG disintegrating tablet  Every 8 hours PRN        04/26/23 2238    oxyCODONE-acetaminophen (PERCOCET) 5-325 MG tablet  Every 8 hours PRN        04/26/23 2238             Note:  This document was prepared using Dragon voice recognition software and may include unintentional dictation errors.    Lissa Hoard, PA-C 04/27/23 1517    Phineas Semen, MD 04/27/23 [phone]

## 2023-04-26 NOTE — Discharge Instructions (Signed)
Your show order x-ray reveals a surgical neck fracture.  He stable at this time and will be treated with a sling for the arm.  Take the patient pain medicine as needed.  Apply ice to help reduce pain and swelling.  He will follow-up with Dr. Audelia Acton at [facility] orthopedics next week.  You may call his office tomorrow to arrange an appointment.

## 2023-04-26 NOTE — ED Triage Notes (Addendum)
Pt reports mechanical fall up steps. Reports R shoulder and R knee pain. Denies hitting head or LOC. Pt ambulatory to triage and then placed in wheelchair. Pt alert and oriented following commands. Breathing unlabored speaking in full sentences. Symmetric chest rise and fall. Radial pulse palpable R side. CMS intact. R arm ROM limited. No obvious deformity.

## 2023-04-26 NOTE — ED Triage Notes (Signed)
Pt has hx of HTN. States it is time for her to take her medicine and did not do so before arrival to ED. Denies chest pain or pressure.

## 2023-05-01 ENCOUNTER — Emergency Department: Payer: No Typology Code available for payment source

## 2023-05-01 ENCOUNTER — Other Ambulatory Visit: Payer: Self-pay

## 2023-05-01 ENCOUNTER — Emergency Department
Admission: EM | Admit: 2023-05-01 | Discharge: 2023-05-01 | Disposition: A | Discharge status: Home / Self Care | Payer: No Typology Code available for payment source | Attending: Emergency Medicine | Admitting: Emergency Medicine

## 2023-05-01 ENCOUNTER — Encounter: Payer: Self-pay | Admitting: Emergency Medicine

## 2023-05-01 DIAGNOSIS — K5903 Drug induced constipation: Secondary | ICD-10-CM | POA: Insufficient documentation

## 2023-05-01 DIAGNOSIS — K5900 Constipation, unspecified: Secondary | ICD-10-CM | POA: Diagnosis present

## 2023-05-01 MED ORDER — GLYCERIN (LAXATIVE) 2 G RE SUPP
1.0000 | Freq: Once | RECTAL | Status: AC
  Administered 2023-05-01: 1 via RECTAL
  Filled 2023-05-01 (×2): qty 1

## 2023-05-01 MED ORDER — MAGNESIUM CITRATE PO SOLN
1.0000 | Freq: Once | ORAL | Status: AC
  Administered 2023-05-01: 1 via ORAL
  Filled 2023-05-01: qty 296

## 2023-05-01 NOTE — ED Provider Notes (Signed)
[facility]PART[facility] AT [facility] Provider Note   CSN: [id number] Arrival date & time: 05/01/23  1936     History  Chief Complaint  Patient presents with   Constipation    ROSAISELA LONA is a 57 y.o. female presents to the emergency department valuation of constipation.  She has had 4 days of constipation, has been on narcotics for right proximal humerus fracture.  Has been taking oxycodone.  She is continuing to pass gas and is eating and drinking with no nausea or vomiting.  She feels some cramping in her abdomen.  She denies any chest pain shortness of breath.  No history is of obstruction or ileus or abdominal surgeries  HPI     Home Medications Prior to Admission medications   Medication Sig Start Date End Date Taking? Authorizing Provider  Cholecalciferol (VITAMIN D3) 50 MCG (2000 UT) capsule Take 2,000 Units by mouth daily.    [provider]  estradiol (ESTRACE) 0.1 MG/GM vaginal cream Place 1 Applicatorful vaginally daily. 11/10/21   Schuman, Jaquelyn Bitter, MD  fluticasone (FLONASE) 50 MCG/ACT nasal spray Place 1 spray into both nostrils daily.    [provider]  gabapentin (NEURONTIN) 100 MG capsule Take 100 mg by mouth at bedtime as needed (pain).    [provider]  hydrochlorothiazide (HYDRODIURIL) 25 MG tablet Take 25 mg by mouth daily as needed (fluid).    [provider]  ibuprofen (ADVIL) 600 MG tablet Take 1 tablet (600 mg total) by mouth every 6 (six) hours as needed. 11/01/21   Schuman, Jaquelyn Bitter, MD  levocetirizine (XYZAL) 5 MG tablet Take 5 mg by mouth every evening.    [provider]  levothyroxine (SYNTHROID) 200 MCG tablet Take 200 mcg by mouth daily before breakfast. 01/06/21   [provider]  montelukast (SINGULAIR) 10 MG tablet Take 10 mg by mouth daily. 04/01/21   [provider]  ondansetron (ZOFRAN-ODT) 4 MG disintegrating tablet Take 1 tablet (4 mg total) by mouth  every 8 (eight) hours as needed for nausea or vomiting. 04/26/23   Menshew, Charlesetta Ivory, PA-C  oxyCODONE-acetaminophen (PERCOCET) 5-325 MG tablet Take 1 tablet by mouth every 8 (eight) hours as needed for up to 5 days. 04/26/23 05/01/23  Menshew, Charlesetta Ivory, PA-C  TURMERIC PO Take 1 capsule by mouth in the morning and at bedtime.    [provider]  verapamil (CALAN-SR) 240 MG CR tablet Take 240 mg by mouth at bedtime.    [provider]  vitamin B-12 (CYANOCOBALAMIN) 500 MCG tablet Take 500 mcg by mouth daily.    [provider]  XIIDRA 5 % SOLN Place 1 drop into both eyes daily. 12/15/20   [provider]      Allergies    Codeine, Venlafaxine, Tape, and Lisinopril    Review of Systems   Review of Systems  Physical Exam Updated Vital Signs BP (!) 148/78 (BP Location: Left Arm)   Pulse 93   Temp 98.2 F (36.8 C) (Oral)   Resp 18   SpO2 100%  Physical Exam Constitutional:      Appearance: She is well-developed.  HENT:     Head: Normocephalic and atraumatic.  Eyes:     Conjunctiva/sclera: Conjunctivae normal.  Cardiovascular:     Rate and Rhythm: Normal rate.  Pulmonary:     Effort: Pulmonary effort is normal. No respiratory distress.  Abdominal:     General: Abdomen is flat. Bowel sounds are  normal. There is no distension.     Palpations: Abdomen is soft.     Tenderness: There is no abdominal tenderness. There is no right CVA tenderness, left CVA tenderness, guarding or rebound.  Genitourinary:    Comments: Mild stool present in the rectal vault.  Stool hard and partially disimpacted.  Glycerin suppository placed.  No blood noted. Musculoskeletal:        General: Normal range of motion.     Cervical back: Normal range of motion.  Skin:    General: Skin is warm.     Findings: No rash.  Neurological:     Mental Status: She is alert and oriented to person, place, and time.  Psychiatric:        Behavior: Behavior normal.         Thought Content: Thought content normal.     ED Results / Procedures / Treatments   Labs (all labs ordered are listed, but only abnormal results are displayed) Labs Reviewed - No data to display  EKG None  Radiology DG Abdomen 1 View  Result Date: 05/01/2023 CLINICAL DATA:  Constipation for days after taking narcotics for pain after fractured arm last week. EXAM: ABDOMEN - 1 VIEW COMPARISON:  None Available. FINDINGS: Scattered gas and stool throughout the colon. No small or large bowel distention. No radiopaque stones. Degenerative changes in the spine and hips. Lung bases are clear. IMPRESSION: Normal nonobstructive bowel gas pattern. Electronically Signed   By: Burman Nieves M.D.   On: 05/01/2023 21:48    Procedures Procedures    Medications Ordered in ED Medications  Glycerin (Adult) 2 g suppository 1 suppository (1 suppository Rectal Given 05/01/23 2249)  magnesium citrate solution 1 Bottle (1 Bottle Oral Given 05/01/23 2259)    ED Course/ Medical Decision Making/ A&P                                 Medical Decision Making Amount and/or Complexity of Data Reviewed Radiology: ordered.  Risk OTC drugs.  57 year old female with constipation.  She has no nausea vomiting tolerating p.o. well.  She has positive bowel sounds and is passing gas.  KUB shows normal bowel gas pattern.  She has taken 2 Dulcolax tablets today.  No bowel movement in 4 days.  She is given a glycerin suppository and rectal examination showed no severe rectal impaction.  Portions of rectal vault was cleaned with digital exam and suppository placed.  Patient was given magnesium citrate to take at home.  She will continue with Dulcolax daily, magnesium citrate daily as well as suppository daily.  She is given strict return precautions and return for any severe pain, vomiting, worsening symptoms or any urgent changes in her health. Final Clinical Impression(s) / ED Diagnoses Final diagnoses:  Drug-induced  constipation    Rx / DC Orders ED Discharge Orders     None         Ronnette Juniper 05/01/23 2303    Corena Herter, MD 05/02/23 [phone]

## 2023-05-01 NOTE — ED Triage Notes (Signed)
Pt presents ambulatory to triage via POV with complaints of constipation x 4 days after taking a narcotic for pain after fracturing her arm last week. Pt has tried dulcolax without any improvement. Pt is still passing gas and is consuming food and water. A&Ox4 at this time. Denies N/V/D, CP or SOB.

## 2023-05-01 NOTE — Discharge Instructions (Signed)
Continue with Dulcolax daily along with daily suppositories as needed.  You may drink an entire bottle of magnesium citrate daily as needed.  Return to the ER for any vomiting, fevers, increasing pain, worsening symptoms or any urgent changes in your health.
